# Patient Record
Sex: Male | Born: 1945 | Race: Black or African American | Hispanic: No | Marital: Married | Smoking: Former smoker
Health system: Southern US, Community
[De-identification: ages and names within clinical notes are randomized; demographics above are authoritative.]

## PROBLEM LIST (undated history)

## (undated) DIAGNOSIS — Z973 Presence of spectacles and contact lenses: Secondary | ICD-10-CM

## (undated) DIAGNOSIS — H409 Unspecified glaucoma: Secondary | ICD-10-CM

## (undated) DIAGNOSIS — M1990 Unspecified osteoarthritis, unspecified site: Secondary | ICD-10-CM

## (undated) DIAGNOSIS — N529 Male erectile dysfunction, unspecified: Secondary | ICD-10-CM

## (undated) DIAGNOSIS — C61 Malignant neoplasm of prostate: Secondary | ICD-10-CM

## (undated) DIAGNOSIS — H5702 Anisocoria: Secondary | ICD-10-CM

## (undated) DIAGNOSIS — Z8719 Personal history of other diseases of the digestive system: Secondary | ICD-10-CM

## (undated) DIAGNOSIS — I10 Essential (primary) hypertension: Secondary | ICD-10-CM

## (undated) DIAGNOSIS — E785 Hyperlipidemia, unspecified: Secondary | ICD-10-CM

## (undated) DIAGNOSIS — M25469 Effusion, unspecified knee: Secondary | ICD-10-CM

## (undated) HISTORY — DX: Unspecified glaucoma: H40.9

## (undated) HISTORY — PX: PROSTATE BIOPSY: SHX241

## (undated) HISTORY — DX: Anisocoria: H57.02

## (undated) HISTORY — DX: Hyperlipidemia, unspecified: E78.5

## (undated) HISTORY — DX: Male erectile dysfunction, unspecified: N52.9

## (undated) HISTORY — PX: COLONOSCOPY: SHX174

## (undated) HISTORY — DX: Essential (primary) hypertension: I10

## (undated) HISTORY — DX: Effusion, unspecified knee: M25.469

---

## 2002-11-04 HISTORY — PX: EYE SURGERY: SHX253

## 2004-07-29 ENCOUNTER — Emergency Department (HOSPITAL_COMMUNITY): Admission: EM | Admit: 2004-07-29 | Discharge: 2004-07-29 | Payer: Self-pay | Admitting: Emergency Medicine

## 2010-06-22 ENCOUNTER — Emergency Department (HOSPITAL_COMMUNITY): Admission: EM | Admit: 2010-06-22 | Discharge: 2010-06-22 | Payer: Self-pay | Admitting: Emergency Medicine

## 2013-08-02 ENCOUNTER — Emergency Department (HOSPITAL_COMMUNITY)
Admission: EM | Admit: 2013-08-02 | Discharge: 2013-08-02 | Disposition: A | Discharge status: Home / Self Care | Payer: No Typology Code available for payment source | Attending: Emergency Medicine | Admitting: Emergency Medicine

## 2013-08-02 ENCOUNTER — Encounter (HOSPITAL_COMMUNITY): Payer: Self-pay | Admitting: Emergency Medicine

## 2013-08-02 DIAGNOSIS — R61 Generalized hyperhidrosis: Secondary | ICD-10-CM | POA: Insufficient documentation

## 2013-08-02 DIAGNOSIS — J069 Acute upper respiratory infection, unspecified: Secondary | ICD-10-CM | POA: Insufficient documentation

## 2013-08-02 DIAGNOSIS — B9789 Other viral agents as the cause of diseases classified elsewhere: Secondary | ICD-10-CM

## 2013-08-02 MED ORDER — BENZONATATE 100 MG PO CAPS: 100.0000 mg | ORAL_CAPSULE | Freq: Three times a day (TID) | ORAL | Status: DC | PRN

## 2013-08-02 MED ORDER — PSEUDOEPHEDRINE HCL ER 120 MG PO TB12: 120.0000 mg | ORAL_TABLET | Freq: Two times a day (BID) | ORAL | Status: DC

## 2013-08-02 NOTE — ED Provider Notes (Signed)
CSN: [id number]     Arrival date & time 08/02/13  2130 History   First MD Initiated Contact with Patient 08/02/13 [phone]     Chief Complaint  Patient presents with  . Nasal Congestion  . Cough   (Consider location/radiation/quality/duration/timing/severity/associated sxs/prior Treatment) HPI Comments: Patient reports he has been sick for three days with cough productive of yellow sputum, sneezing, nasal congestion, and sweating.  Has tried mucinex and alkaselzer with minimal relief and vicks vapor rub with moderate relief.  Previously had sore throat and ear pain but these have improved.  Pt states grandchildren have been sick with same but they have already improved.  Denies chest pain, SOB, wheezing, abdominal pain, N/V/D, difficulty swallowing.   Patient is a 67 y.o. male presenting with cough. The history is provided by the patient.  Cough Associated symptoms: diaphoresis and rhinorrhea   Associated symptoms: no chest pain, no ear pain, no fever, no shortness of breath and no sore throat     History reviewed. No pertinent past medical history. History reviewed. No pertinent past surgical history. History reviewed. No pertinent family history. History  Substance Use Topics  . Smoking status: Never Smoker   . Smokeless tobacco: Never Used  . Alcohol Use: No    Review of Systems  Constitutional: Positive for diaphoresis. Negative for fever.  HENT: Positive for congestion and rhinorrhea. Negative for ear pain, sore throat, trouble swallowing, voice change and sinus pressure.   Respiratory: Positive for cough. Negative for shortness of breath.   Cardiovascular: Negative for chest pain.  Gastrointestinal: Negative for nausea, vomiting, abdominal pain and diarrhea.    Allergies  Review of patient's allergies indicates no known allergies.  Home Medications  No current outpatient prescriptions on file. BP 138/80  Pulse 85  Temp(Src) 98 F (36.7 C) (Oral)  Ht 5\' 10"  (1.778 m)  Wt  180 lb (81.647 kg)  BMI 25.83 kg/m2  SpO2 98% Physical Exam  Nursing note and vitals reviewed. Constitutional: He appears well-developed and well-nourished. No distress.  HENT:  Head: Normocephalic and atraumatic.  Mouth/Throat: Uvula is midline. Mucous membranes are not dry. No edematous. Posterior oropharyngeal erythema present. No oropharyngeal exudate, posterior oropharyngeal edema or tonsillar abscesses.  Neck: Neck supple.  Cardiovascular: Normal rate and regular rhythm.   Pulmonary/Chest: Effort normal and breath sounds normal. No accessory muscle usage. Not tachypneic. No respiratory distress. He has no decreased breath sounds. He has no wheezes. He has no rhonchi. He has no rales.  Abdominal: Soft. He exhibits no distension and no mass. There is no tenderness. There is no rebound and no guarding.  Lymphadenopathy:    He has no cervical adenopathy.  Neurological: He is alert. He exhibits normal muscle tone.  Skin: He is not diaphoretic.    ED Course  Procedures (including critical care time) Labs Review Labs Reviewed - No data to display Imaging Review No results found.  MDM   1. Viral respiratory illness    Pt with three days of respiratory illness in an otherwise healthy patient. Lungs CTAB.  Afebrile, nontoxic. No meningeal signs.  Oropharynx with erythema only.  No sinus tenderness.  Likely viral illness.  +sick contacts at home.  D/C home with medications for symptoms (sudafed, tessalon perles).  PCP follow up.  Discussed  findings, treatment, and follow up  with patient.  Pt given return precautions.  Pt verbalizes understanding and agrees with plan.       I doubt any other EMC precluding discharge at this  time including, but not necessarily limited to the following: significant bacterial infection including pneumonia, strep throat, bacterial sinusitis, meningitis  Trixie Dredge, PA-C 08/02/13 0809

## 2013-08-02 NOTE — ED Notes (Signed)
Pt presents to ED with c/o of head cold and chest congestion x3 days.  Pt denies n/v, diarrhea, no body aches.  Pt reports taking mucinex and Alka Seltzer but states they have been ineffective.

## 2013-08-02 NOTE — ED Provider Notes (Signed)
Medical screening examination/treatment/procedure(s) were conducted as a shared visit with non-physician practitioner(s) and myself.  I personally evaluated the patient during the encounter.  Presented with URI symptoms consisting of nasal congestion and cough. Lung exam clear, no wheezine, no concern for pneumonia. Treat symptomatically.  Gilda Crease, MD 08/02/13 [phone]

## 2014-04-14 ENCOUNTER — Encounter (INDEPENDENT_AMBULATORY_CARE_PROVIDER_SITE_OTHER): Payer: Self-pay | Admitting: Surgery

## 2014-04-25 ENCOUNTER — Ambulatory Visit (INDEPENDENT_AMBULATORY_CARE_PROVIDER_SITE_OTHER): Payer: Commercial Managed Care - HMO | Admitting: Surgery

## 2014-04-25 ENCOUNTER — Encounter (INDEPENDENT_AMBULATORY_CARE_PROVIDER_SITE_OTHER): Payer: Self-pay | Admitting: Surgery

## 2014-04-25 VITALS — BP 126/80 | HR 67 | Temp 98.4°F | Ht 69.0 in | Wt 188.0 lb

## 2014-04-25 DIAGNOSIS — K4090 Unilateral inguinal hernia, without obstruction or gangrene, not specified as recurrent: Secondary | ICD-10-CM

## 2014-04-25 NOTE — Progress Notes (Signed)
Patient ID: Manuel Bowers, male   DOB: [birth year], 68 y.o.   MRN: [id number]  Chief Complaint  Patient presents with  . eval lih    HPI Manuel Bowers is a 68 y.o. male.  Referred by Dr. Lona Kettle for evaluation of left inguinal hernia  HPI This is a healthy 68 year old male who presents with a two-month history of a visible bulge in his left groin. This occasionally becomes larger and more uncomfortable. It remains reducible. He denies any obstructive symptoms. 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  He has and able to continue working without problems. Evaluated by Dr. Harrington Challenger who felt that he had a left inguinal hernia. He is now referred for surgical evaluation. Past Medical History  Diagnosis Date  . Hypertension   . ED (erectile dysfunction)   . Elevated lipids   . Fluid in knee   . Glaucoma   . Anisocoria     Past Surgical History  Procedure Laterality Date  . Eye surgery      History reviewed. No pertinent family history.  Social History History  Substance Use Topics  . Smoking status: Never Smoker   . Smokeless tobacco: Never Used  . Alcohol Use: No    No Known Allergies  Current Outpatient Prescriptions  Medication Sig Dispense Refill  . aspirin 81 MG tablet Take 81 mg by mouth daily.      . beta carotene w/minerals (OCUVITE) tablet Take 1 tablet by mouth daily.      . brimonidine (ALPHAGAN) 0.2 % ophthalmic solution 3 (three) times daily.      . Dorzolamide HCl-Timolol Mal PF 22.3-6.8 MG/ML SOLN Apply to eye.      . hydrochlorothiazide (HYDRODIURIL) 25 MG tablet Take 25 mg by mouth daily.      . simvastatin (ZOCOR) 40 MG tablet Take 40 mg by mouth daily.       No current facility-administered medications for this visit.    Review of Systems Review of Systems  Constitutional: Negative for fever, chills and unexpected weight change.  HENT: Negative for congestion, hearing loss, sore throat, trouble swallowing and  voice change.   Eyes: Negative for visual disturbance.  Respiratory: Negative for cough and wheezing.   Cardiovascular: Negative for chest pain, palpitations and leg swelling.  Gastrointestinal: Negative for nausea, vomiting, abdominal pain, diarrhea, constipation, blood in stool, abdominal distention, anal bleeding and rectal pain.  Genitourinary: Positive for testicular pain. Negative for hematuria and difficulty urinating.  Musculoskeletal: Negative for arthralgias.  Skin: Negative for rash and wound.  Neurological: Negative for seizures, syncope, weakness and headaches.  Hematological: Negative for adenopathy. Does not bruise/bleed easily.  Psychiatric/Behavioral: Negative for confusion.    Blood pressure 126/80, pulse 67, temperature 98.4 F (36.9 C), height 5\' 9"  (1.753 m), weight 188 lb (85.276 kg).  Physical Exam Physical Exam WDWN in NAD HEENT:  EOMI, sclera anicteric Neck:  No masses, no thyromegaly Lungs:  CTA bilaterally; normal respiratory effort CV:  Regular rate and rhythm; no murmurs Abd:  +bowel sounds, soft, non-tender, no masses GU: Bilateral descended testes, no testicular masses, no sign of right inguinal hernia, reducible left inguinal hernia with Valsalva maneuver. Ext:  Well-perfused; no edema Skin:  Warm, dry; no sign of jaundice  Data Reviewed none  Assessment    Left inguinal hernia - reducible     Plan    Left inguinal hernia repair with mesh.  The surgical procedure has been discussed with  the patient.  Potential risks, benefits, alternative treatments, and expected outcomes have been explained.  All of the patient's questions at this time have been answered.  The likelihood of reaching the patient's treatment goal is good.  The patient understand the proposed surgical procedure and wishes to proceed.  Hold ASA 5 days        Shelita Steptoe K. 04/25/2014, 12:17 PM

## 2014-05-19 ENCOUNTER — Encounter (HOSPITAL_BASED_OUTPATIENT_CLINIC_OR_DEPARTMENT_OTHER): Payer: Self-pay | Admitting: *Deleted

## 2014-05-19 NOTE — Progress Notes (Signed)
05/19/14 1126  OBSTRUCTIVE SLEEP APNEA  Have you ever been diagnosed with sleep apnea through a sleep study? No  Do you snore loudly (loud enough to be heard through closed doors)?  0  Do you often feel tired, fatigued, or sleepy during the daytime? 0  Has anyone observed you stop breathing during your sleep? 0  Do you have, or are you being treated for high blood pressure? 1  BMI more than 35 kg/m2? 0  Age over 71 years old? 1  Neck circumference greater than 40 cm/16 inches? 1  Gender: 1  Obstructive Sleep Apnea Score 4  Score 4 or greater  Results sent to PCP

## 2014-05-19 NOTE — Progress Notes (Signed)
To come in for ekg-bmet-very active and healthy-

## 2014-05-23 ENCOUNTER — Other Ambulatory Visit: Payer: Self-pay

## 2014-05-23 ENCOUNTER — Encounter (HOSPITAL_BASED_OUTPATIENT_CLINIC_OR_DEPARTMENT_OTHER)
Admission: RE | Admit: 2014-05-23 | Discharge: 2014-05-23 | Disposition: A | Discharge status: Home / Self Care | Payer: Medicare PPO | Source: Ambulatory Visit | Attending: Surgery | Admitting: Surgery

## 2014-05-23 DIAGNOSIS — Z01812 Encounter for preprocedural laboratory examination: Secondary | ICD-10-CM | POA: Diagnosis not present

## 2014-05-23 DIAGNOSIS — N529 Male erectile dysfunction, unspecified: Secondary | ICD-10-CM | POA: Diagnosis not present

## 2014-05-23 DIAGNOSIS — I10 Essential (primary) hypertension: Secondary | ICD-10-CM | POA: Diagnosis not present

## 2014-05-23 DIAGNOSIS — K4090 Unilateral inguinal hernia, without obstruction or gangrene, not specified as recurrent: Secondary | ICD-10-CM | POA: Diagnosis not present

## 2014-05-23 DIAGNOSIS — H409 Unspecified glaucoma: Secondary | ICD-10-CM | POA: Diagnosis not present

## 2014-05-23 DIAGNOSIS — Z7982 Long term (current) use of aspirin: Secondary | ICD-10-CM | POA: Diagnosis not present

## 2014-05-23 DIAGNOSIS — H5702 Anisocoria: Secondary | ICD-10-CM | POA: Diagnosis not present

## 2014-05-23 LAB — BASIC METABOLIC PANEL
Anion gap: 13 (ref 5–15)
BUN: 19 mg/dL (ref 6–23)
CHLORIDE: 97 meq/L (ref 96–112)
CO2: 29 mEq/L (ref 19–32)
Calcium: 9.6 mg/dL (ref 8.4–10.5)
Creatinine, Ser: 0.9 mg/dL (ref 0.50–1.35)
GFR calc Af Amer: >90 mL/min (ref >90)
GFR, EST NON AFRICAN AMERICAN: 84 mL/min — AB (ref >90)
GLUCOSE: 112 mg/dL — AB (ref 70–99)
POTASSIUM: 4.3 meq/L (ref 3.7–5.3)
SODIUM: 139 meq/L (ref 137–147)

## 2014-05-24 ENCOUNTER — Encounter (HOSPITAL_BASED_OUTPATIENT_CLINIC_OR_DEPARTMENT_OTHER)
Admission: RE | Disposition: A | Discharge status: Home / Self Care | Payer: Self-pay | Source: Ambulatory Visit | Attending: Surgery

## 2014-05-24 ENCOUNTER — Ambulatory Visit (HOSPITAL_BASED_OUTPATIENT_CLINIC_OR_DEPARTMENT_OTHER)
Admission: RE | Admit: 2014-05-24 | Discharge: 2014-05-24 | Disposition: A | Discharge status: Home / Self Care | Payer: Medicare PPO | Source: Ambulatory Visit | Attending: Surgery | Admitting: Surgery

## 2014-05-24 ENCOUNTER — Ambulatory Visit (HOSPITAL_BASED_OUTPATIENT_CLINIC_OR_DEPARTMENT_OTHER): Payer: Medicare PPO | Admitting: Anesthesiology

## 2014-05-24 ENCOUNTER — Encounter (HOSPITAL_BASED_OUTPATIENT_CLINIC_OR_DEPARTMENT_OTHER): Payer: Medicare PPO | Admitting: Anesthesiology

## 2014-05-24 ENCOUNTER — Encounter (HOSPITAL_BASED_OUTPATIENT_CLINIC_OR_DEPARTMENT_OTHER): Payer: Self-pay | Admitting: *Deleted

## 2014-05-24 DIAGNOSIS — K4090 Unilateral inguinal hernia, without obstruction or gangrene, not specified as recurrent: Secondary | ICD-10-CM

## 2014-05-24 DIAGNOSIS — Z7982 Long term (current) use of aspirin: Secondary | ICD-10-CM | POA: Insufficient documentation

## 2014-05-24 DIAGNOSIS — Z01812 Encounter for preprocedural laboratory examination: Secondary | ICD-10-CM | POA: Insufficient documentation

## 2014-05-24 DIAGNOSIS — H409 Unspecified glaucoma: Secondary | ICD-10-CM | POA: Insufficient documentation

## 2014-05-24 DIAGNOSIS — I10 Essential (primary) hypertension: Secondary | ICD-10-CM | POA: Insufficient documentation

## 2014-05-24 DIAGNOSIS — N529 Male erectile dysfunction, unspecified: Secondary | ICD-10-CM | POA: Insufficient documentation

## 2014-05-24 DIAGNOSIS — H5702 Anisocoria: Secondary | ICD-10-CM | POA: Insufficient documentation

## 2014-05-24 HISTORY — DX: Presence of spectacles and contact lenses: Z97.3

## 2014-05-24 HISTORY — PX: INGUINAL HERNIA REPAIR: SHX194

## 2014-05-24 LAB — POCT HEMOGLOBIN-HEMACUE: Hemoglobin: 14.7 g/dL (ref 13.0–17.0)

## 2014-05-24 SURGERY — REPAIR, HERNIA, INGUINAL, ADULT
Anesthesia: General | Site: Groin | Laterality: Left

## 2014-05-24 MED ORDER — MIDAZOLAM HCL 2 MG/2ML IJ SOLN
1.0000 mg | INTRAMUSCULAR | Status: DC | PRN
  Administered 2014-05-24: 2 mg via INTRAVENOUS

## 2014-05-24 MED ORDER — SUCCINYLCHOLINE CHLORIDE 20 MG/ML IJ SOLN
INTRAMUSCULAR | Status: AC
  Filled 2014-05-24: qty 1

## 2014-05-24 MED ORDER — PROPOFOL 10 MG/ML IV BOLUS
INTRAVENOUS | Status: DC | PRN
  Administered 2014-05-24: 180 mg via INTRAVENOUS

## 2014-05-24 MED ORDER — HYDROCODONE-ACETAMINOPHEN 5-325 MG PO TABS
1.0000 | ORAL_TABLET | ORAL | Status: DC | PRN
  Administered 2014-05-24: 1 via ORAL

## 2014-05-24 MED ORDER — DEXAMETHASONE SODIUM PHOSPHATE 4 MG/ML IJ SOLN
INTRAMUSCULAR | Status: DC | PRN
  Administered 2014-05-24: 8 mg via INTRAVENOUS

## 2014-05-24 MED ORDER — MORPHINE SULFATE 2 MG/ML IJ SOLN: 2.0000 mg | INTRAMUSCULAR | Status: DC | PRN

## 2014-05-24 MED ORDER — MIDAZOLAM HCL 2 MG/2ML IJ SOLN
INTRAMUSCULAR | Status: AC
  Filled 2014-05-24: qty 2

## 2014-05-24 MED ORDER — CHLORHEXIDINE GLUCONATE 4 % EX LIQD
1.0000 | Freq: Once | CUTANEOUS | Status: DC
Start: 2014-05-25 — End: 2014-05-24

## 2014-05-24 MED ORDER — FENTANYL CITRATE 0.05 MG/ML IJ SOLN
50.0000 ug | INTRAMUSCULAR | Status: DC | PRN
  Administered 2014-05-24: 100 ug via INTRAVENOUS

## 2014-05-24 MED ORDER — LIDOCAINE HCL (CARDIAC) 20 MG/ML IV SOLN
INTRAVENOUS | Status: DC | PRN
  Administered 2014-05-24: 75 mg via INTRAVENOUS

## 2014-05-24 MED ORDER — FENTANYL CITRATE 0.05 MG/ML IJ SOLN
INTRAMUSCULAR | Status: AC
  Filled 2014-05-24: qty 2

## 2014-05-24 MED ORDER — BUPIVACAINE-EPINEPHRINE 0.25% -1:200000 IJ SOLN
INTRAMUSCULAR | Status: DC | PRN
  Administered 2014-05-24: 10 mL

## 2014-05-24 MED ORDER — ONDANSETRON HCL 4 MG/2ML IJ SOLN
INTRAMUSCULAR | Status: DC | PRN
  Administered 2014-05-24: 4 mg via INTRAVENOUS

## 2014-05-24 MED ORDER — BUPIVACAINE HCL (PF) 0.25 % IJ SOLN
INTRAMUSCULAR | Status: DC | PRN
  Administered 2014-05-24: 40 mL

## 2014-05-24 MED ORDER — ONDANSETRON HCL 4 MG/2ML IJ SOLN: 4.0000 mg | INTRAMUSCULAR | Status: DC | PRN

## 2014-05-24 MED ORDER — BUPIVACAINE-EPINEPHRINE (PF) 0.25% -1:200000 IJ SOLN
INTRAMUSCULAR | Status: AC
  Filled 2014-05-24: qty 30

## 2014-05-24 MED ORDER — LACTATED RINGERS IV SOLN
INTRAVENOUS | Status: DC
  Administered 2014-05-24: 07:00:00 via INTRAVENOUS
  Administered 2014-05-24: 10 mL/h via INTRAVENOUS

## 2014-05-24 MED ORDER — CEFAZOLIN SODIUM-DEXTROSE 2-3 GM-% IV SOLR
INTRAVENOUS | Status: AC
  Filled 2014-05-24: qty 50

## 2014-05-24 MED ORDER — PROPOFOL 10 MG/ML IV EMUL
INTRAVENOUS | Status: AC
Start: 2014-05-24 — End: 2014-05-24
  Filled 2014-05-24: qty 100

## 2014-05-24 MED ORDER — HYDROCODONE-ACETAMINOPHEN 5-325 MG PO TABS: 1.0000 | ORAL_TABLET | ORAL | Status: DC | PRN

## 2014-05-24 MED ORDER — FENTANYL CITRATE 0.05 MG/ML IJ SOLN
INTRAMUSCULAR | Status: AC
  Filled 2014-05-24: qty 6

## 2014-05-24 MED ORDER — HYDROCODONE-ACETAMINOPHEN 5-325 MG PO TABS
ORAL_TABLET | ORAL | Status: AC
  Filled 2014-05-24: qty 1

## 2014-05-24 MED ORDER — CEFAZOLIN SODIUM-DEXTROSE 2-3 GM-% IV SOLR
2.0000 g | INTRAVENOUS | Status: AC
  Administered 2014-05-24: 2 g via INTRAVENOUS

## 2014-05-24 SURGICAL SUPPLY — 60 items
APL SKNCLS STERI-STRIP NONHPOA (GAUZE/BANDAGES/DRESSINGS) ×1
BENZOIN TINCTURE PRP APPL 2/3 (GAUZE/BANDAGES/DRESSINGS) ×3 IMPLANT
BINDER BREAST LRG (GAUZE/BANDAGES/DRESSINGS) ×2 IMPLANT
BLADE HEX COATED 2.75 (ELECTRODE) ×3 IMPLANT
BLADE SURG 15 STRL LF DISP TIS (BLADE) ×2 IMPLANT
BLADE SURG 15 STRL SS (BLADE) ×3
BLADE SURG ROTATE 9660 (MISCELLANEOUS) ×2 IMPLANT
CANISTER SUCT 1200ML W/VALVE (MISCELLANEOUS) IMPLANT
CHLORAPREP W/TINT 26ML (MISCELLANEOUS) ×3 IMPLANT
CLOSURE WOUND 1/2 X4 (GAUZE/BANDAGES/DRESSINGS) ×1
COVER MAYO STAND STRL (DRAPES) ×3 IMPLANT
COVER TABLE BACK 60X90 (DRAPES) ×3 IMPLANT
DECANTER SPIKE VIAL GLASS SM (MISCELLANEOUS) ×3 IMPLANT
DRAIN PENROSE 1/2X12 LTX STRL (WOUND CARE) ×3 IMPLANT
DRAPE LAPAROTOMY TRNSV 102X78 (DRAPE) ×3 IMPLANT
DRAPE UTILITY XL STRL (DRAPES) ×3 IMPLANT
DRSG TEGADERM 4X4.75 (GAUZE/BANDAGES/DRESSINGS) ×3 IMPLANT
ELECT REM PT RETURN 9FT ADLT (ELECTROSURGICAL) ×3
ELECTRODE REM PT RTRN 9FT ADLT (ELECTROSURGICAL) ×1 IMPLANT
GLOVE BIO SURGEON STRL SZ 6.5 (GLOVE) ×1 IMPLANT
GLOVE BIO SURGEON STRL SZ7 (GLOVE) ×3 IMPLANT
GLOVE BIO SURGEONS STRL SZ 6.5 (GLOVE) ×1
GLOVE BIOGEL PI IND STRL 7.0 (GLOVE) IMPLANT
GLOVE BIOGEL PI IND STRL 7.5 (GLOVE) ×1 IMPLANT
GLOVE BIOGEL PI INDICATOR 7.0 (GLOVE) ×2
GLOVE BIOGEL PI INDICATOR 7.5 (GLOVE) ×2
GLOVE EXAM NITRILE PF MED BLUE (GLOVE) ×2 IMPLANT
GOWN STRL REUS W/ TWL LRG LVL3 (GOWN DISPOSABLE) ×2 IMPLANT
GOWN STRL REUS W/TWL LRG LVL3 (GOWN DISPOSABLE) ×6
MESH PARIETEX PROGRIP LEFT (Mesh General) ×2 IMPLANT
NDL HYPO 25X1 1.5 SAFETY (NEEDLE) ×1 IMPLANT
NEEDLE HYPO 25X1 1.5 SAFETY (NEEDLE) ×3 IMPLANT
NS IRRIG 1000ML POUR BTL (IV SOLUTION) ×2 IMPLANT
PACK BASIN DAY SURGERY FS (CUSTOM PROCEDURE TRAY) ×3 IMPLANT
PENCIL BUTTON HOLSTER BLD 10FT (ELECTRODE) ×3 IMPLANT
SLEEVE SCD COMPRESS KNEE MED (MISCELLANEOUS) ×3 IMPLANT
SPONGE GAUZE 4X4 12PLY STER LF (GAUZE/BANDAGES/DRESSINGS) ×3 IMPLANT
SPONGE INTESTINAL PEANUT (DISPOSABLE) ×3 IMPLANT
SPONGE LAP 18X18 X RAY DECT (DISPOSABLE) IMPLANT
STRIP CLOSURE SKIN 1/2X4 (GAUZE/BANDAGES/DRESSINGS) ×2 IMPLANT
SUT MON AB 4-0 PC3 18 (SUTURE) ×3 IMPLANT
SUT PDS AB 0 CT 36 (SUTURE) IMPLANT
SUT SILK 2 0 SH (SUTURE) IMPLANT
SUT SILK 3 0 SH 30 (SUTURE) IMPLANT
SUT SILK 3 0 TIES 17X18 (SUTURE)
SUT SILK 3-0 18XBRD TIE BLK (SUTURE) IMPLANT
SUT VIC AB 0 CT1 27 (SUTURE) ×3
SUT VIC AB 0 CT1 27XBRD ANBCTR (SUTURE) ×1 IMPLANT
SUT VIC AB 0 SH 27 (SUTURE) IMPLANT
SUT VIC AB 2-0 SH 27 (SUTURE) ×3
SUT VIC AB 2-0 SH 27XBRD (SUTURE) ×1 IMPLANT
SUT VIC AB 3-0 SH 27 (SUTURE) ×3
SUT VIC AB 3-0 SH 27X BRD (SUTURE) ×1 IMPLANT
SYRINGE CONTROL L 12CC (SYRINGE) ×3 IMPLANT
SYRINGE CONTROL LL 12CC (SYRINGE) ×1 IMPLANT
TOWEL OR 17X24 6PK STRL BLUE (TOWEL DISPOSABLE) ×3 IMPLANT
TOWEL OR NON WOVEN STRL DISP B (DISPOSABLE) ×3 IMPLANT
TUBE CONNECTING 20'X1/4 (TUBING)
TUBE CONNECTING 20X1/4 (TUBING) IMPLANT
YANKAUER SUCT BULB TIP NO VENT (SUCTIONS) IMPLANT

## 2014-05-24 NOTE — Anesthesia Preprocedure Evaluation (Signed)
Anesthesia Evaluation  Patient identified by MRN, date of birth, ID band Patient awake    Reviewed: Allergy & Precautions, H&P , NPO status , Patient's Chart, lab work & pertinent test results  History of Anesthesia Complications Negative for: history of anesthetic complications  Airway Mallampati: I  Neck ROM: Full    Dental  (+) Teeth Intact   Pulmonary neg pulmonary ROS,  breath sounds clear to auscultation        Cardiovascular hypertension, Rhythm:Regular Rate:Normal     Neuro/Psych negative neurological ROS     GI/Hepatic negative GI ROS, Neg liver ROS,   Endo/Other  negative endocrine ROS  Renal/GU      Musculoskeletal   Abdominal   Peds  Hematology   Anesthesia Other Findings   Reproductive/Obstetrics                           Anesthesia Physical Anesthesia Plan  ASA: II  Anesthesia Plan:    Post-op Pain Management:    Induction: Intravenous  Airway Management Planned: Oral ETT  Additional Equipment:   Intra-op Plan:   Post-operative Plan: Extubation in OR  Informed Consent: I have reviewed the patients History and Physical, chart, labs and discussed the procedure including the risks, benefits and alternatives for the proposed anesthesia with the patient or authorized representative who has indicated his/her understanding and acceptance.   Dental advisory given  Plan Discussed with: CRNA and Surgeon  Anesthesia Plan Comments:         Anesthesia Quick Evaluation

## 2014-05-24 NOTE — Interval H&P Note (Signed)
History and Physical Interval Note:  05/24/2014 7:21 AM  Manuel Bowers  has presented today for surgery, with the diagnosis of left inguinal hernia  The various methods of treatment have been discussed with the patient and family. After consideration of risks, benefits and other options for treatment, the patient has consented to  Procedure(s): LEFT INGUINAL HERNIA REPAIR  (Left) as a surgical intervention .  The patient's history has been reviewed, patient examined, no change in status, stable for surgery.  I have reviewed the patient's chart and labs.  Questions were answered to the patient's satisfaction.     Connelly [place] K.

## 2014-05-24 NOTE — Anesthesia Procedure Notes (Addendum)
Anesthesia Regional Block:  TAP block  Pre-Anesthetic Checklist: ,, timeout performed, Correct Patient, Correct Site, Correct Laterality, Correct Procedure, Correct Position, site marked, Risks and benefits discussed,  Surgical consent,  Pre-op evaluation,  At surgeon's request and post-op pain management  Laterality: Left and Upper  Prep: chloraprep       Needles:   Needle Type: Echogenic Needle     Needle Length: 9cm 9 cm Needle Gauge: 21 and 21 G  Needle insertion depth: 4 cm   Additional Needles:  Procedures: ultrasound guided (picture in chart) TAP block Narrative:  Start time: 05/24/2014 7:00 AM End time: 05/24/2014 7:12 AM Injection made incrementally with aspirations every 5 mL.  Performed by: Personally  Anesthesiologist: T Massagee  Additional Notes: Monitors on , sedation begun,tolerated well   Procedure Name: LMA Insertion Date/Time: 05/24/2014 7:37 AM Performed by: Melynda Ripple D Pre-anesthesia Checklist: Patient identified, Emergency Drugs available, Suction available and Patient being monitored Patient Re-evaluated:Patient Re-evaluated prior to inductionOxygen Delivery Method: Circle System Utilized Preoxygenation: Pre-oxygenation with 100% oxygen Intubation Type: IV induction Ventilation: Mask ventilation without difficulty LMA: LMA inserted LMA Size: 4.0 Number of attempts: 1 Airway Equipment and Method: bite block Placement Confirmation: positive ETCO2 Tube secured with: Tape Dental Injury: Teeth and Oropharynx as per pre-operative assessment

## 2014-05-24 NOTE — Anesthesia Postprocedure Evaluation (Signed)
  Anesthesia Post-op Note  Patient: Manuel Bowers  Procedure(s) Performed: Procedure(s): LEFT INGUINAL HERNIA REPAIR  (Left)  Patient Location: PACU  Anesthesia Type:GA combined with regional for post-op pain  Level of Consciousness: awake and alert   Airway and Oxygen Therapy: Patient Spontanous Breathing  Post-op Pain: none  Post-op Assessment: Post-op Vital signs reviewed  Post-op Vital Signs: stable  Last Vitals:  Filed Vitals:   05/24/14 0930  BP: 131/72  Pulse: 57  Temp:   Resp: 18    Complications: No apparent anesthesia complications

## 2014-05-24 NOTE — Progress Notes (Signed)
AssistedDr. Massagee with left, ultrasound guided, transabdominal plane block. Side rails up, monitors on throughout procedure. See vital signs in flow sheet. Tolerated Procedure well.  

## 2014-05-24 NOTE — H&P (View-Only) (Signed)
Patient ID: Manuel Bowers, male   DOB: 03-01-1946, 68 y.o.   MRN: [id number]  Chief Complaint  Patient presents with  . eval lih    HPI Manuel Bowers is a 68 y.o. male.  Referred by Dr. Lona Kettle for evaluation of left inguinal hernia  HPI This is a healthy 68 year old male who presents with a two-month history of a visible bulge in his left groin. This occasionally becomes larger and more uncomfortable. It remains reducible. He denies any obstructive symptoms. 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  He has and able to continue working without problems. Evaluated by Dr. Harrington Challenger who felt that he had a left inguinal hernia. He is now referred for surgical evaluation. Past Medical History  Diagnosis Date  . Hypertension   . ED (erectile dysfunction)   . Elevated lipids   . Fluid in knee   . Glaucoma   . Anisocoria     Past Surgical History  Procedure Laterality Date  . Eye surgery      History reviewed. No pertinent family history.  Social History History  Substance Use Topics  . Smoking status: Never Smoker   . Smokeless tobacco: Never Used  . Alcohol Use: No    No Known Allergies  Current Outpatient Prescriptions  Medication Sig Dispense Refill  . aspirin 81 MG tablet Take 81 mg by mouth daily.      . beta carotene w/minerals (OCUVITE) tablet Take 1 tablet by mouth daily.      . brimonidine (ALPHAGAN) 0.2 % ophthalmic solution 3 (three) times daily.      . Dorzolamide HCl-Timolol Mal PF 22.3-6.8 MG/ML SOLN Apply to eye.      . hydrochlorothiazide (HYDRODIURIL) 25 MG tablet Take 25 mg by mouth daily.      . simvastatin (ZOCOR) 40 MG tablet Take 40 mg by mouth daily.       No current facility-administered medications for this visit.    Review of Systems Review of Systems  Constitutional: Negative for fever, chills and unexpected weight change.  HENT: Negative for congestion, hearing loss, sore throat, trouble swallowing and  voice change.   Eyes: Negative for visual disturbance.  Respiratory: Negative for cough and wheezing.   Cardiovascular: Negative for chest pain, palpitations and leg swelling.  Gastrointestinal: Negative for nausea, vomiting, abdominal pain, diarrhea, constipation, blood in stool, abdominal distention, anal bleeding and rectal pain.  Genitourinary: Positive for testicular pain. Negative for hematuria and difficulty urinating.  Musculoskeletal: Negative for arthralgias.  Skin: Negative for rash and wound.  Neurological: Negative for seizures, syncope, weakness and headaches.  Hematological: Negative for adenopathy. Does not bruise/bleed easily.  Psychiatric/Behavioral: Negative for confusion.    Blood pressure 126/80, pulse 67, temperature 98.4 F (36.9 C), height 5\' 9"  (1.753 m), weight 188 lb (85.276 kg).  Physical Exam Physical Exam WDWN in NAD HEENT:  EOMI, sclera anicteric Neck:  No masses, no thyromegaly Lungs:  CTA bilaterally; normal respiratory effort CV:  Regular rate and rhythm; no murmurs Abd:  +bowel sounds, soft, non-tender, no masses GU: Bilateral descended testes, no testicular masses, no sign of right inguinal hernia, reducible left inguinal hernia with Valsalva maneuver. Ext:  Well-perfused; no edema Skin:  Warm, dry; no sign of jaundice  Data Reviewed none  Assessment    Left inguinal hernia - reducible     Plan    Left inguinal hernia repair with mesh.  The surgical procedure has been discussed with  the patient.  Potential risks, benefits, alternative treatments, and expected outcomes have been explained.  All of the patient's questions at this time have been answered.  The likelihood of reaching the patient's treatment goal is good.  The patient understand the proposed surgical procedure and wishes to proceed.  Hold ASA 5 days        TSUEI,MATTHEW K. 04/25/2014, 12:17 PM

## 2014-05-24 NOTE — Op Note (Signed)
Hernia, Open, Procedure Note  Indications: The patient presented with a history of a left, reducible inguinal hernia.    Pre-operative Diagnosis: left reducible inguinal hernia Post-operative Diagnosis: same  Surgeon: Maia Petties.   Assistants: none  Anesthesia: General LMA anesthesia and TAP block  ASA Class: 2  Procedure Details  The patient was seen again in the Holding Room. The risks, benefits, complications, treatment options, and expected outcomes were discussed with the patient. The possibilities of reaction to medication, pulmonary aspiration, perforation of viscus, bleeding, recurrent infection, the need for additional procedures, and development of a complication requiring transfusion or further operation were discussed with the patient and/or family. The likelihood of success in repairing the hernia and returning the patient to their previous functional status is good.  There was concurrence with the proposed plan, and informed consent was obtained. The site of surgery was properly noted/marked. The patient was taken to the Operating Room, identified as Manuel Bowers, and the procedure verified as left inguinal hernia repair. A Time Out was held and the above information confirmed.  The patient was placed in the supine position and underwent induction of anesthesia. The lower abdomen and groin was prepped with Chloraprep and draped in the standard fashion, and 0.25% Marcaine with epinephrine was used to anesthetize the skin over the mid-portion of the inguinal canal. An oblique incision was made. Dissection was carried down through the subcutaneous tissue with cautery to the external oblique fascia.  We opened the external oblique fascia along the direction of its fibers to the external ring.  The spermatic cord was circumferentially dissected bluntly and retracted with a Penrose drain.  The ilioinguinal nerve was identified and preserved.  The floor of the inguinal canal was  inspected and was intact.  We skeletonized the spermatic cord and reduced a moderate-sized indirect hernia sac and cord lipoma.  We used a left-sided Progrip mesh which was inserted and deployed across the floor of the inguinal canal. The mesh was tucked underneath the external oblique fascia laterally.  The flap of the mesh was closed around the spermatic cord to recreate the internal inguinal ring.  The mesh was secured to the pubic tubercle with 0 Vicryl.  The external oblique fascia was reapproximated with 2-0 Vicryl.  3-0 Vicryl was used to close the subcutaneous tissues and 4-0 Monocryl was used to close the skin in subcuticular fashion.  Benzoin and steri-strips were used to seal the incision.  A clean dressing was applied.  The patient was then extubated and brought to the recovery room in stable condition.  All sponge, instrument, and needle counts were correct prior to closure and at the conclusion of the case.   Estimated Blood Loss: Minimal                 Complications: None; patient tolerated the procedure well.         Disposition: PACU - hemodynamically stable.         Condition: stable  [name]. Georgette Dover, MD, [facility]  General/ Trauma Surgery  05/24/2014 8:24 AM

## 2014-05-24 NOTE — Transfer of Care (Signed)
Immediate Anesthesia Transfer of Care Note  Patient: Manuel Bowers  Procedure(s) Performed: Procedure(s): LEFT INGUINAL HERNIA REPAIR  (Left)  Patient Location: PACU  Anesthesia Type:General and Regional  Level of Consciousness: sedated  Airway & Oxygen Therapy: Patient Spontanous Breathing and Patient connected to face mask oxygen  Post-op Assessment: Report given to PACU RN and Post -op Vital signs reviewed and stable  Post vital signs: Reviewed and stable  Complications: No apparent anesthesia complications

## 2014-05-24 NOTE — Discharge Instructions (Signed)
[facility] ° ° INGUINAL HERNIA REPAIR: POST OP INSTRUCTIONS ° °Always review your discharge instruction sheet given to you by the facility where your surgery was performed. °IF YOU HAVE DISABILITY OR FAMILY LEAVE FORMS, YOU MUST BRING THEM TO THE OFFICE FOR PROCESSING.   °DO NOT GIVE THEM TO YOUR DOCTOR. ° °1. A  prescription for pain medication may be given to you upon discharge.  Take your pain medication as prescribed, if needed.  If narcotic pain medicine is not needed, then you may take acetaminophen (Tylenol) or ibuprofen (Advil) as needed. °2. Take your usually prescribed medications unless otherwise directed. °3. If you need a refill on your pain medication, please contact your pharmacy.  They will contact our office to request authorization. Prescriptions will not be filled after 5 pm or on week-ends. °4. You should follow a light diet the first 24 hours after arrival home, such as soup and crackers, etc.  Be sure to include lots of fluids daily.  Resume your normal diet the day after surgery. °5. Most patients will experience some swelling and bruising around the umbilicus or in the groin and scrotum.  Ice packs and reclining will help.  Swelling and bruising can take several days to resolve.  °6. It is common to experience some constipation if taking pain medication after surgery.  Increasing fluid intake and taking a stool softener (such as Colace) will usually help or prevent this problem from occurring.  A mild laxative (Milk of Magnesia or Miralax) should be taken according to package directions if there are no bowel movements after 48 hours. °7. Unless discharge instructions indicate otherwise, you may remove your bandages 24-48 hours after surgery, and you may shower at that time.  You will have steri-strips (small skin tapes) in place directly over the incision.  These strips should be left on the skin for 7-10 days. °8. ACTIVITIES:  You may resume regular (light) daily activities  beginning the next day--such as daily self-care, walking, climbing stairs--gradually increasing activities as tolerated.  You may have sexual intercourse when it is comfortable.  Refrain from any heavy lifting or straining until approved by your doctor. °a. You may drive when you are no longer taking prescription pain medication, you can comfortably wear a seatbelt, and you can safely maneuver your car and apply brakes. °b. RETURN TO WORK:  2-3 weeks with light duty - no lifting over 15 lbs. °9. You should see your doctor in the office for a follow-up appointment approximately 2-3 weeks after your surgery.  Make sure that you call for this appointment within a day or two after you arrive home to insure a convenient appointment time. °10. OTHER INSTRUCTIONS:  __________________________________________________________________________________________________________________________________________________________________________________________  °WHEN TO CALL YOUR DOCTOR: °1. Fever over 101.0 °2. Inability to urinate °3. Nausea and/or vomiting °4. Extreme swelling or bruising °5. Continued bleeding from incision. °6. Increased pain, redness, or drainage from the incision ° °The clinic staff is available to answer your questions during regular business hours.  Please don’t hesitate to call and ask to speak to one of the nurses for clinical concerns.  If you have a medical emergency, go to the nearest emergency room or call 911.  A surgeon from [facility] is always on call at the hospital ° ° °[address], [address], [place], [place]  [address] ? ° [address] [address], [place],    [address] °[phone]    [phone]    FAX [phone] °Web site:   [email or web address]   Post Anesthesia Home Care Instructions  Activity: Get plenty of rest for the remainder of the day. A responsible adult should stay with you for 24 hours following the procedure.  For the next 24 hours, DO  NOT: -Drive a car -Paediatric nurse -Drink alcoholic beverages -Take any medication unless instructed by your physician -Make any legal decisions or sign important papers.  Meals: Start with liquid foods such as gelatin or soup. Progress to regular foods as tolerated. Avoid greasy, spicy, heavy foods. If nausea and/or vomiting occur, drink only clear liquids until the nausea and/or vomiting subsides. Call your physician if vomiting continues.  Special Instructions/Symptoms: Your throat may feel dry or sore from the anesthesia or the breathing tube placed in your throat during surgery. If this causes discomfort, gargle with warm salt water. The discomfort should disappear within 24 hours.

## 2014-05-25 ENCOUNTER — Encounter (HOSPITAL_BASED_OUTPATIENT_CLINIC_OR_DEPARTMENT_OTHER): Payer: Self-pay | Admitting: Surgery

## 2014-05-25 ENCOUNTER — Telehealth (INDEPENDENT_AMBULATORY_CARE_PROVIDER_SITE_OTHER): Payer: Self-pay

## 2014-05-25 NOTE — Telephone Encounter (Signed)
Calling pt to see how he was doing after sx. Pt states that he has been doing good. Reminded pt of po appt. Informed pt that if they needed anything before then to give [place] a call back. Pt verbalized understanding.

## 2014-06-14 ENCOUNTER — Ambulatory Visit (INDEPENDENT_AMBULATORY_CARE_PROVIDER_SITE_OTHER): Payer: Commercial Managed Care - HMO | Admitting: Surgery

## 2014-06-14 ENCOUNTER — Encounter (INDEPENDENT_AMBULATORY_CARE_PROVIDER_SITE_OTHER): Payer: Self-pay | Admitting: Surgery

## 2014-06-14 VITALS — BP 150/90 | HR 65 | Temp 97.6°F | Ht 69.0 in | Wt 195.4 lb

## 2014-06-14 DIAGNOSIS — K4090 Unilateral inguinal hernia, without obstruction or gangrene, not specified as recurrent: Secondary | ICD-10-CM

## 2014-06-14 NOTE — Progress Notes (Signed)
Status post left inguinal hernia repair with mesh on 05/24/14 for a moderate size indirect hernia. The patient is doing very well. He had virtually no pain after surgery. We did use a TA P. Block. The swelling is going down nicely. No testicular pain.  His incision is well-healed with no sign of infection or seroma. No sign of recurrent hernia. Minimal scar tissue formation.  The patient should continue limiting his activity for the next 3 weeks and then may resume full activity. Followup when necessary.  [name]. Georgette Dover, MD, [facility]  General/ Trauma Surgery  06/14/2014 9:04 AM

## 2014-11-30 DIAGNOSIS — H2512 Age-related nuclear cataract, left eye: Secondary | ICD-10-CM | POA: Diagnosis not present

## 2014-11-30 DIAGNOSIS — H524 Presbyopia: Secondary | ICD-10-CM | POA: Diagnosis not present

## 2014-11-30 DIAGNOSIS — H4011X3 Primary open-angle glaucoma, severe stage: Secondary | ICD-10-CM | POA: Diagnosis not present

## 2014-12-16 DIAGNOSIS — M1990 Unspecified osteoarthritis, unspecified site: Secondary | ICD-10-CM | POA: Diagnosis not present

## 2014-12-16 DIAGNOSIS — J9801 Acute bronchospasm: Secondary | ICD-10-CM | POA: Diagnosis not present

## 2014-12-16 DIAGNOSIS — J209 Acute bronchitis, unspecified: Secondary | ICD-10-CM | POA: Diagnosis not present

## 2015-01-24 DIAGNOSIS — J209 Acute bronchitis, unspecified: Secondary | ICD-10-CM | POA: Diagnosis not present

## 2015-01-24 DIAGNOSIS — N399 Disorder of urinary system, unspecified: Secondary | ICD-10-CM | POA: Diagnosis not present

## 2015-01-28 ENCOUNTER — Emergency Department (INDEPENDENT_AMBULATORY_CARE_PROVIDER_SITE_OTHER): Payer: Medicare PPO

## 2015-01-28 ENCOUNTER — Emergency Department (INDEPENDENT_AMBULATORY_CARE_PROVIDER_SITE_OTHER)
Admission: EM | Admit: 2015-01-28 | Discharge: 2015-01-28 | Disposition: A | Discharge status: Home / Self Care | Payer: Commercial Managed Care - HMO | Source: Home / Self Care | Attending: Family Medicine | Admitting: Family Medicine

## 2015-01-28 ENCOUNTER — Encounter (HOSPITAL_COMMUNITY): Payer: Self-pay | Admitting: *Deleted

## 2015-01-28 DIAGNOSIS — M19042 Primary osteoarthritis, left hand: Secondary | ICD-10-CM

## 2015-01-28 DIAGNOSIS — S6992XA Unspecified injury of left wrist, hand and finger(s), initial encounter: Secondary | ICD-10-CM | POA: Diagnosis not present

## 2015-01-28 MED ORDER — COLCHICINE 0.6 MG PO TABS: 0.6000 mg | ORAL_TABLET | Freq: Two times a day (BID) | ORAL | Status: DC

## 2015-01-28 MED ORDER — INDOMETHACIN 25 MG PO CAPS: 25.0000 mg | ORAL_CAPSULE | Freq: Three times a day (TID) | ORAL | Status: DC

## 2015-01-28 NOTE — Discharge Instructions (Signed)
Wear splint and take medicine as prescribed, see your doctor if further problems.

## 2015-01-28 NOTE — ED Notes (Signed)
Pt  Reports  Pain in his  l  Wrist  X  3  Days  -  He  denys  Any  Injury       He  States  Has  Had  Arthritis  In  Past

## 2015-01-28 NOTE — ED Provider Notes (Signed)
CSN: [id number]     Arrival date & time 01/28/15  1512 History   First MD Initiated Contact with Patient 01/28/15 1613     Chief Complaint  Patient presents with  . Wrist Pain   (Consider location/radiation/quality/duration/timing/severity/associated sxs/prior Treatment) Patient is a 69 y.o. male presenting with wrist pain. The history is provided by the patient.  Wrist Pain This is a new problem. The current episode started more than 2 days ago. The problem has been gradually worsening. Pertinent negatives include no chest pain and no abdominal pain. The symptoms are aggravated by bending.    Past Medical History  Diagnosis Date  . Hypertension   . ED (erectile dysfunction)   . Elevated lipids   . Fluid in knee   . Glaucoma   . Anisocoria   . Wears glasses    Past Surgical History  Procedure Laterality Date  . Eye surgery  2004    lt eye for glaucoma  . Colonoscopy    . Inguinal hernia repair Left 05/24/2014    Procedure: LEFT INGUINAL HERNIA REPAIR ;  Surgeon: [name]. Georgette Dover, MD;  Location: [facility];  Service: General;  Laterality: Left;   History reviewed. No pertinent family history. History  Substance Use Topics  . Smoking status: Never Smoker   . Smokeless tobacco: Never Used  . Alcohol Use: No    Review of Systems  Constitutional: Negative.   Cardiovascular: Negative for chest pain.  Gastrointestinal: Negative for abdominal pain.  Musculoskeletal: Positive for joint swelling.  Skin: Negative for wound.    Allergies  Review of patient's allergies indicates no known allergies.  Home Medications   Prior to Admission medications   Medication Sig Start Date End Date Taking? Authorizing Provider  aspirin 81 MG tablet Take 81 mg by mouth daily.    Historical Provider, MD  brimonidine (ALPHAGAN) 0.2 % ophthalmic solution 3 (three) times daily.    Historical Provider, MD  colchicine 0.6 MG tablet Take 1 tablet (0.6 mg total) by mouth 2 (two)  times daily. 01/28/15   Billy Fischer, MD  Dorzolamide HCl-Timolol Mal PF 22.3-6.8 MG/ML SOLN Apply to eye.    Historical Provider, MD  hydrochlorothiazide (HYDRODIURIL) 25 MG tablet Take 25 mg by mouth daily.    Historical Provider, MD  indomethacin (INDOCIN) 25 MG capsule Take 1 capsule (25 mg total) by mouth 3 (three) times daily with meals. 01/28/15   Billy Fischer, MD  simvastatin (ZOCOR) 40 MG tablet Take 40 mg by mouth daily.    Historical Provider, MD   BP 151/69 mmHg  Pulse 82  Temp(Src) 98.6 F (37 C) (Oral)  Resp 16  SpO2 98% Physical Exam  Constitutional: He is oriented to person, place, and time. He appears well-developed and well-nourished.  Musculoskeletal: He exhibits tenderness.       Left wrist: He exhibits decreased range of motion, tenderness, bony tenderness, swelling and effusion. He exhibits no crepitus, no deformity and no laceration.  Neurological: He is alert and oriented to person, place, and time.  Skin: Skin is warm and dry.  Nursing note and vitals reviewed.   ED Course  Procedures (including critical care time) Labs Review Labs Reviewed - No data to display  Imaging Review Dg Wrist Complete Left  01/28/2015   CLINICAL DATA:  69 year old male with 2-3 day history of left-sided wrist pain. No history of injury.  EXAM: LEFT WRIST - COMPLETE 3+ VIEW  COMPARISON:  No priors.  FINDINGS: Four  views of the left wrist demonstrate no acute displaced fracture. There is advanced joint space narrowing, subchondral sclerosis, subchondral cyst formation and osteophyte formation at the first carpometacarpal joint, with mild proximal subluxation of the first metacarpal, compatible with advanced osteoarthritis.  IMPRESSION: 1. No acute radiographic abnormality of the left wrist. 2. 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   Electronically Signed   By: Vinnie Langton M.D.   On: 01/28/2015 16:55   X-rays reviewed and report per radiologist.   MDM   1.  Primary osteoarthritis of left hand        Billy Fischer, MD 01/28/15 [phone]

## 2015-02-10 DIAGNOSIS — H4011X3 Primary open-angle glaucoma, severe stage: Secondary | ICD-10-CM | POA: Diagnosis not present

## 2015-02-21 DIAGNOSIS — M79642 Pain in left hand: Secondary | ICD-10-CM | POA: Diagnosis not present

## 2015-03-21 DIAGNOSIS — H4011X2 Primary open-angle glaucoma, moderate stage: Secondary | ICD-10-CM | POA: Diagnosis not present

## 2015-03-21 DIAGNOSIS — H4011X3 Primary open-angle glaucoma, severe stage: Secondary | ICD-10-CM | POA: Diagnosis not present

## 2015-04-24 DIAGNOSIS — H2511 Age-related nuclear cataract, right eye: Secondary | ICD-10-CM | POA: Diagnosis not present

## 2015-04-24 DIAGNOSIS — H18412 Arcus senilis, left eye: Secondary | ICD-10-CM | POA: Diagnosis not present

## 2015-04-24 DIAGNOSIS — H4011X3 Primary open-angle glaucoma, severe stage: Secondary | ICD-10-CM | POA: Diagnosis not present

## 2015-04-24 DIAGNOSIS — H18411 Arcus senilis, right eye: Secondary | ICD-10-CM | POA: Diagnosis not present

## 2015-04-24 DIAGNOSIS — Z961 Presence of intraocular lens: Secondary | ICD-10-CM | POA: Diagnosis not present

## 2015-04-24 DIAGNOSIS — H4011X1 Primary open-angle glaucoma, mild stage: Secondary | ICD-10-CM | POA: Diagnosis not present

## 2015-09-08 DIAGNOSIS — H401192 Primary open-angle glaucoma, unspecified eye, moderate stage: Secondary | ICD-10-CM | POA: Diagnosis not present

## 2015-09-08 DIAGNOSIS — Z961 Presence of intraocular lens: Secondary | ICD-10-CM | POA: Diagnosis not present

## 2015-10-06 DIAGNOSIS — Z125 Encounter for screening for malignant neoplasm of prostate: Secondary | ICD-10-CM | POA: Diagnosis not present

## 2015-10-06 DIAGNOSIS — M150 Primary generalized (osteo)arthritis: Secondary | ICD-10-CM | POA: Diagnosis not present

## 2015-10-06 DIAGNOSIS — E782 Mixed hyperlipidemia: Secondary | ICD-10-CM | POA: Diagnosis not present

## 2015-10-06 DIAGNOSIS — I10 Essential (primary) hypertension: Secondary | ICD-10-CM | POA: Diagnosis not present

## 2015-10-06 DIAGNOSIS — Z0000 Encounter for general adult medical examination without abnormal findings: Secondary | ICD-10-CM | POA: Diagnosis not present

## 2016-01-14 ENCOUNTER — Encounter (HOSPITAL_COMMUNITY): Payer: Self-pay | Admitting: Emergency Medicine

## 2016-01-14 ENCOUNTER — Emergency Department (HOSPITAL_COMMUNITY)
Admission: EM | Admit: 2016-01-14 | Discharge: 2016-01-14 | Disposition: A | Discharge status: Home / Self Care | Payer: Commercial Managed Care - HMO | Attending: Emergency Medicine | Admitting: Emergency Medicine

## 2016-01-14 ENCOUNTER — Emergency Department (HOSPITAL_COMMUNITY): Payer: Commercial Managed Care - HMO

## 2016-01-14 DIAGNOSIS — Z87438 Personal history of other diseases of male genital organs: Secondary | ICD-10-CM | POA: Insufficient documentation

## 2016-01-14 DIAGNOSIS — I10 Essential (primary) hypertension: Secondary | ICD-10-CM | POA: Diagnosis not present

## 2016-01-14 DIAGNOSIS — H409 Unspecified glaucoma: Secondary | ICD-10-CM | POA: Diagnosis not present

## 2016-01-14 DIAGNOSIS — M1990 Unspecified osteoarthritis, unspecified site: Secondary | ICD-10-CM | POA: Insufficient documentation

## 2016-01-14 DIAGNOSIS — M7989 Other specified soft tissue disorders: Secondary | ICD-10-CM | POA: Diagnosis not present

## 2016-01-14 DIAGNOSIS — E785 Hyperlipidemia, unspecified: Secondary | ICD-10-CM | POA: Diagnosis not present

## 2016-01-14 DIAGNOSIS — M79645 Pain in left finger(s): Secondary | ICD-10-CM | POA: Diagnosis present

## 2016-01-14 DIAGNOSIS — Z79899 Other long term (current) drug therapy: Secondary | ICD-10-CM | POA: Insufficient documentation

## 2016-01-14 DIAGNOSIS — Z7982 Long term (current) use of aspirin: Secondary | ICD-10-CM | POA: Insufficient documentation

## 2016-01-14 DIAGNOSIS — Z973 Presence of spectacles and contact lenses: Secondary | ICD-10-CM | POA: Insufficient documentation

## 2016-01-14 HISTORY — DX: Unspecified osteoarthritis, unspecified site: M19.90

## 2016-01-14 MED ORDER — HYDROCODONE-ACETAMINOPHEN 5-325 MG PO TABS: 1.0000 | ORAL_TABLET | Freq: Four times a day (QID) | ORAL | Status: DC | PRN

## 2016-01-14 NOTE — ED Provider Notes (Signed)
CSN: [id number]     Arrival date & time 01/14/16  1322 History   First MD Initiated Contact with Patient 01/14/16 1341     Chief Complaint  Patient presents with  . hand swelling      (Consider location/radiation/quality/duration/timing/severity/associated sxs/prior Treatment) HPI Comments: 70 year old right-handed male who presents with left hand pain. Patient states that for the past one week, he has had swelling involving primarily his thumb and fifth finger on left hand. Pain is worse with movement and he suspects that it is related to arthritis. He denies any redness or skin changes. No fevers or recent illness. No recent trauma to his hand. He does use his hands a lot at work. Taking ibuprofen without relief of his pain.  The history is provided by the patient.    Past Medical History  Diagnosis Date  . Hypertension   . ED (erectile dysfunction)   . Elevated lipids   . Fluid in knee   . Glaucoma   . Anisocoria   . Wears glasses   . Arthritis    Past Surgical History  Procedure Laterality Date  . Eye surgery  2004    lt eye for glaucoma  . Colonoscopy    . Inguinal hernia repair Left 05/24/2014    Procedure: LEFT INGUINAL HERNIA REPAIR ;  Surgeon: [name]. Georgette Dover, MD;  Location: [facility];  Service: General;  Laterality: Left;   No family history on file. Social History  Substance Use Topics  . Smoking status: Never Smoker   . Smokeless tobacco: Never Used  . Alcohol Use: No    Review of Systems 10 Systems reviewed and are negative for acute change except as noted in the HPI.    Allergies  Review of patient's allergies indicates no known allergies.  Home Medications   Prior to Admission medications   Medication Sig Start Date End Date Taking? Authorizing Provider  aspirin 81 MG tablet Take 81 mg by mouth daily.   Yes Historical Provider, MD  brimonidine (ALPHAGAN) 0.2 % ophthalmic solution Place 1 drop into both eyes 3 (three) times daily.     Yes Historical Provider, MD  Dorzolamide HCl-Timolol Mal PF 22.3-6.8 MG/ML SOLN Apply 1 drop to eye 2 (two) times daily.    Yes Historical Provider, MD  hydrochlorothiazide (HYDRODIURIL) 25 MG tablet Take 25 mg by mouth daily.   Yes Historical Provider, MD  ibuprofen (ADVIL,MOTRIN) 200 MG tablet Take 200 mg by mouth every 8 (eight) hours as needed for moderate pain.   Yes Historical Provider, MD  latanoprost (XALATAN) 0.005 % ophthalmic solution Place 1 drop into both eyes at bedtime. 12/02/15  Yes Historical Provider, MD  simvastatin (ZOCOR) 40 MG tablet Take 40 mg by mouth daily.   Yes Historical Provider, MD   BP 114/74 mmHg  Pulse 64  Temp(Src) 97.6 F (36.4 C) (Oral)  Resp 16  SpO2 97% Physical Exam  Constitutional: He is oriented to person, place, and time. He appears well-developed and well-nourished. No distress.  HENT:  Head: Normocephalic and atraumatic.  Eyes: Conjunctivae are normal.  Cardiovascular: Intact distal pulses.   Pulmonary/Chest: Effort normal.  Musculoskeletal:  Uniform mild edema and mild tenderness of left 5th finger, esp at DIP and PIP, TTP and mild swelling of 1st MCP joint; normal ROM wrist and fingers, normal grip strength w/ limited ROM 5th finger  Neurological: He is alert and oriented to person, place, and time.  Skin: Skin is warm and dry. No rash  noted. No erythema.  Psychiatric: He has a normal mood and affect. Judgment normal.  Nursing note and vitals reviewed.   ED Course  Procedures (including critical care time) Labs Review Labs Reviewed - No data to display  Imaging Review Dg Hand Complete Left  01/14/2016  CLINICAL DATA:  Swelling in the left hand for 1 week. EXAM: LEFT HAND - COMPLETE 3+ VIEW COMPARISON:  Left wrist radiographs 01/28/2015 FINDINGS: Soft tissue swelling is noted over the ulnar aspect of the wrist. There is soft tissue swelling in the fifth digit is well. Degenerative changes are noted at the first [facility] joint. No other focal  osseous abnormality is present. IMPRESSION: 1. Soft tissue swelling along the ulnar aspect of the wrist to the small finger without underlying osseous abnormality or radiopaque foreign body. 2. Stable advanced degenerative changes at the first [facility] joint. Electronically Signed   By: San Morelle M.D.   On: 01/14/2016 14:07      MDM   Final diagnoses:  None   Patient with 1 week of left hand pain involving first MCP joint and fifth finger as well as intermittently his left wrist. On exam he had swelling in these areas without any skin changes to suggest infection. No history of gout and no sx to suggest infection. Plain film shows advanced arthritis of first [facility] joint, soft tissue swelling with no other maladies. Instructed patient on supportive care, he already has a thumb brace that he can use at home. Provided with small amount of Lortab to use at night for intractable pain. Provided with hand surgeon follow-up information if symptoms do not improve. Return precautions reviewed and patient discharged in satisfactory condition.  Sharlett Iles, MD 01/14/16 1540

## 2016-01-14 NOTE — ED Notes (Signed)
Per pt, states left hand and finger swelling for over a week-thinks it is arthritis

## 2016-01-15 ENCOUNTER — Telehealth: Payer: Self-pay | Admitting: Emergency Medicine

## 2016-01-25 DIAGNOSIS — M19042 Primary osteoarthritis, left hand: Secondary | ICD-10-CM | POA: Diagnosis not present

## 2016-03-22 DIAGNOSIS — H18412 Arcus senilis, left eye: Secondary | ICD-10-CM | POA: Diagnosis not present

## 2016-03-22 DIAGNOSIS — H401111 Primary open-angle glaucoma, right eye, mild stage: Secondary | ICD-10-CM | POA: Diagnosis not present

## 2016-03-22 DIAGNOSIS — H401123 Primary open-angle glaucoma, left eye, severe stage: Secondary | ICD-10-CM | POA: Diagnosis not present

## 2016-03-22 DIAGNOSIS — Z961 Presence of intraocular lens: Secondary | ICD-10-CM | POA: Diagnosis not present

## 2016-03-22 DIAGNOSIS — I10 Essential (primary) hypertension: Secondary | ICD-10-CM | POA: Diagnosis not present

## 2016-03-22 DIAGNOSIS — H18411 Arcus senilis, right eye: Secondary | ICD-10-CM | POA: Diagnosis not present

## 2016-07-12 DIAGNOSIS — M7751 Other enthesopathy of right foot: Secondary | ICD-10-CM | POA: Diagnosis not present

## 2016-07-12 DIAGNOSIS — M659 Synovitis and tenosynovitis, unspecified: Secondary | ICD-10-CM | POA: Diagnosis not present

## 2016-07-12 DIAGNOSIS — M79671 Pain in right foot: Secondary | ICD-10-CM | POA: Diagnosis not present

## 2016-07-12 DIAGNOSIS — M19071 Primary osteoarthritis, right ankle and foot: Secondary | ICD-10-CM | POA: Diagnosis not present

## 2016-07-12 DIAGNOSIS — M10071 Idiopathic gout, right ankle and foot: Secondary | ICD-10-CM | POA: Diagnosis not present

## 2016-07-17 DIAGNOSIS — Z961 Presence of intraocular lens: Secondary | ICD-10-CM | POA: Diagnosis not present

## 2016-07-17 DIAGNOSIS — H2511 Age-related nuclear cataract, right eye: Secondary | ICD-10-CM | POA: Diagnosis not present

## 2016-07-17 DIAGNOSIS — H25011 Cortical age-related cataract, right eye: Secondary | ICD-10-CM | POA: Diagnosis not present

## 2016-07-17 DIAGNOSIS — H401134 Primary open-angle glaucoma, bilateral, indeterminate stage: Secondary | ICD-10-CM | POA: Diagnosis not present

## 2016-07-30 DIAGNOSIS — E790 Hyperuricemia without signs of inflammatory arthritis and tophaceous disease: Secondary | ICD-10-CM | POA: Diagnosis not present

## 2016-07-30 DIAGNOSIS — Z79899 Other long term (current) drug therapy: Secondary | ICD-10-CM | POA: Diagnosis not present

## 2016-07-30 DIAGNOSIS — Z23 Encounter for immunization: Secondary | ICD-10-CM | POA: Diagnosis not present

## 2016-07-30 DIAGNOSIS — I10 Essential (primary) hypertension: Secondary | ICD-10-CM | POA: Diagnosis not present

## 2016-08-01 DIAGNOSIS — H401112 Primary open-angle glaucoma, right eye, moderate stage: Secondary | ICD-10-CM | POA: Diagnosis not present

## 2016-08-01 DIAGNOSIS — H25011 Cortical age-related cataract, right eye: Secondary | ICD-10-CM | POA: Diagnosis not present

## 2016-08-01 DIAGNOSIS — H2511 Age-related nuclear cataract, right eye: Secondary | ICD-10-CM | POA: Diagnosis not present

## 2016-08-01 DIAGNOSIS — H401123 Primary open-angle glaucoma, left eye, severe stage: Secondary | ICD-10-CM | POA: Diagnosis not present

## 2016-09-05 DIAGNOSIS — I10 Essential (primary) hypertension: Secondary | ICD-10-CM | POA: Diagnosis not present

## 2016-09-05 DIAGNOSIS — H4010X2 Unspecified open-angle glaucoma, moderate stage: Secondary | ICD-10-CM | POA: Diagnosis not present

## 2016-09-05 DIAGNOSIS — Z87891 Personal history of nicotine dependence: Secondary | ICD-10-CM | POA: Diagnosis not present

## 2016-09-05 DIAGNOSIS — Z79899 Other long term (current) drug therapy: Secondary | ICD-10-CM | POA: Diagnosis not present

## 2016-09-05 DIAGNOSIS — H2512 Age-related nuclear cataract, left eye: Secondary | ICD-10-CM | POA: Diagnosis not present

## 2016-09-05 DIAGNOSIS — H25011 Cortical age-related cataract, right eye: Secondary | ICD-10-CM | POA: Diagnosis not present

## 2016-09-05 DIAGNOSIS — H401112 Primary open-angle glaucoma, right eye, moderate stage: Secondary | ICD-10-CM | POA: Diagnosis not present

## 2016-09-05 DIAGNOSIS — Z7982 Long term (current) use of aspirin: Secondary | ICD-10-CM | POA: Diagnosis not present

## 2016-09-05 DIAGNOSIS — E785 Hyperlipidemia, unspecified: Secondary | ICD-10-CM | POA: Diagnosis not present

## 2016-09-05 DIAGNOSIS — H2511 Age-related nuclear cataract, right eye: Secondary | ICD-10-CM | POA: Diagnosis not present

## 2016-09-30 DIAGNOSIS — Z9841 Cataract extraction status, right eye: Secondary | ICD-10-CM | POA: Diagnosis not present

## 2016-09-30 DIAGNOSIS — Z961 Presence of intraocular lens: Secondary | ICD-10-CM | POA: Diagnosis not present

## 2016-09-30 DIAGNOSIS — H5989 Other postprocedural complications and disorders of eye and adnexa, not elsewhere classified: Secondary | ICD-10-CM | POA: Diagnosis not present

## 2016-09-30 DIAGNOSIS — H401112 Primary open-angle glaucoma, right eye, moderate stage: Secondary | ICD-10-CM | POA: Diagnosis not present

## 2016-10-01 DIAGNOSIS — H401112 Primary open-angle glaucoma, right eye, moderate stage: Secondary | ICD-10-CM | POA: Diagnosis not present

## 2016-10-01 DIAGNOSIS — H401123 Primary open-angle glaucoma, left eye, severe stage: Secondary | ICD-10-CM | POA: Diagnosis not present

## 2016-10-02 DIAGNOSIS — Z87891 Personal history of nicotine dependence: Secondary | ICD-10-CM | POA: Diagnosis not present

## 2016-10-02 DIAGNOSIS — Z79899 Other long term (current) drug therapy: Secondary | ICD-10-CM | POA: Diagnosis not present

## 2016-10-02 DIAGNOSIS — H401133 Primary open-angle glaucoma, bilateral, severe stage: Secondary | ICD-10-CM | POA: Diagnosis not present

## 2016-10-02 DIAGNOSIS — H401113 Primary open-angle glaucoma, right eye, severe stage: Secondary | ICD-10-CM | POA: Diagnosis not present

## 2016-10-02 DIAGNOSIS — I10 Essential (primary) hypertension: Secondary | ICD-10-CM | POA: Diagnosis not present

## 2016-10-02 DIAGNOSIS — Z7982 Long term (current) use of aspirin: Secondary | ICD-10-CM | POA: Diagnosis not present

## 2016-10-02 DIAGNOSIS — E785 Hyperlipidemia, unspecified: Secondary | ICD-10-CM | POA: Diagnosis not present

## 2016-11-15 DIAGNOSIS — H5203 Hypermetropia, bilateral: Secondary | ICD-10-CM | POA: Diagnosis not present

## 2016-11-15 DIAGNOSIS — H43393 Other vitreous opacities, bilateral: Secondary | ICD-10-CM | POA: Diagnosis not present

## 2016-11-15 DIAGNOSIS — H40033 Anatomical narrow angle, bilateral: Secondary | ICD-10-CM | POA: Diagnosis not present

## 2016-11-15 DIAGNOSIS — H52209 Unspecified astigmatism, unspecified eye: Secondary | ICD-10-CM | POA: Diagnosis not present

## 2016-11-15 DIAGNOSIS — H524 Presbyopia: Secondary | ICD-10-CM | POA: Diagnosis not present

## 2016-11-15 DIAGNOSIS — Z0100 Encounter for examination of eyes and vision without abnormal findings: Secondary | ICD-10-CM | POA: Diagnosis not present

## 2016-11-28 DIAGNOSIS — I10 Essential (primary) hypertension: Secondary | ICD-10-CM | POA: Diagnosis not present

## 2016-11-28 DIAGNOSIS — E782 Mixed hyperlipidemia: Secondary | ICD-10-CM | POA: Diagnosis not present

## 2016-11-28 DIAGNOSIS — M109 Gout, unspecified: Secondary | ICD-10-CM | POA: Diagnosis not present

## 2016-11-28 DIAGNOSIS — Z0000 Encounter for general adult medical examination without abnormal findings: Secondary | ICD-10-CM | POA: Diagnosis not present

## 2016-11-28 DIAGNOSIS — Z125 Encounter for screening for malignant neoplasm of prostate: Secondary | ICD-10-CM | POA: Diagnosis not present

## 2016-11-28 DIAGNOSIS — Z23 Encounter for immunization: Secondary | ICD-10-CM | POA: Diagnosis not present

## 2016-12-11 DIAGNOSIS — H401113 Primary open-angle glaucoma, right eye, severe stage: Secondary | ICD-10-CM | POA: Diagnosis not present

## 2017-01-09 DIAGNOSIS — Z961 Presence of intraocular lens: Secondary | ICD-10-CM | POA: Diagnosis not present

## 2017-01-09 DIAGNOSIS — H401113 Primary open-angle glaucoma, right eye, severe stage: Secondary | ICD-10-CM | POA: Diagnosis not present

## 2017-02-12 DIAGNOSIS — H401113 Primary open-angle glaucoma, right eye, severe stage: Secondary | ICD-10-CM | POA: Diagnosis not present

## 2017-02-12 DIAGNOSIS — H401123 Primary open-angle glaucoma, left eye, severe stage: Secondary | ICD-10-CM | POA: Diagnosis not present

## 2017-02-12 DIAGNOSIS — H401133 Primary open-angle glaucoma, bilateral, severe stage: Secondary | ICD-10-CM | POA: Diagnosis not present

## 2017-02-20 DIAGNOSIS — H401123 Primary open-angle glaucoma, left eye, severe stage: Secondary | ICD-10-CM | POA: Diagnosis not present

## 2017-02-20 DIAGNOSIS — H2511 Age-related nuclear cataract, right eye: Secondary | ICD-10-CM | POA: Diagnosis not present

## 2017-02-20 DIAGNOSIS — Z9841 Cataract extraction status, right eye: Secondary | ICD-10-CM | POA: Diagnosis not present

## 2017-02-20 DIAGNOSIS — M1990 Unspecified osteoarthritis, unspecified site: Secondary | ICD-10-CM | POA: Diagnosis not present

## 2017-02-20 DIAGNOSIS — H401133 Primary open-angle glaucoma, bilateral, severe stage: Secondary | ICD-10-CM | POA: Diagnosis not present

## 2017-02-20 DIAGNOSIS — I10 Essential (primary) hypertension: Secondary | ICD-10-CM | POA: Diagnosis not present

## 2017-02-20 DIAGNOSIS — H25011 Cortical age-related cataract, right eye: Secondary | ICD-10-CM | POA: Diagnosis not present

## 2017-02-20 DIAGNOSIS — E785 Hyperlipidemia, unspecified: Secondary | ICD-10-CM | POA: Diagnosis not present

## 2017-02-20 DIAGNOSIS — Z87891 Personal history of nicotine dependence: Secondary | ICD-10-CM | POA: Diagnosis not present

## 2017-02-20 DIAGNOSIS — Z79899 Other long term (current) drug therapy: Secondary | ICD-10-CM | POA: Diagnosis not present

## 2017-02-20 DIAGNOSIS — H401113 Primary open-angle glaucoma, right eye, severe stage: Secondary | ICD-10-CM | POA: Diagnosis not present

## 2017-03-25 DIAGNOSIS — Z1211 Encounter for screening for malignant neoplasm of colon: Secondary | ICD-10-CM | POA: Diagnosis not present

## 2017-03-25 DIAGNOSIS — K5730 Diverticulosis of large intestine without perforation or abscess without bleeding: Secondary | ICD-10-CM | POA: Diagnosis not present

## 2017-05-30 DIAGNOSIS — R972 Elevated prostate specific antigen [PSA]: Secondary | ICD-10-CM | POA: Diagnosis not present

## 2017-08-07 DIAGNOSIS — R972 Elevated prostate specific antigen [PSA]: Secondary | ICD-10-CM | POA: Diagnosis not present

## 2017-08-15 DIAGNOSIS — H401123 Primary open-angle glaucoma, left eye, severe stage: Secondary | ICD-10-CM | POA: Diagnosis not present

## 2017-08-15 DIAGNOSIS — Z961 Presence of intraocular lens: Secondary | ICD-10-CM | POA: Diagnosis not present

## 2017-08-15 DIAGNOSIS — H401133 Primary open-angle glaucoma, bilateral, severe stage: Secondary | ICD-10-CM | POA: Diagnosis not present

## 2017-08-15 DIAGNOSIS — H401113 Primary open-angle glaucoma, right eye, severe stage: Secondary | ICD-10-CM | POA: Diagnosis not present

## 2017-08-21 DIAGNOSIS — C61 Malignant neoplasm of prostate: Secondary | ICD-10-CM | POA: Diagnosis not present

## 2017-08-22 ENCOUNTER — Other Ambulatory Visit: Payer: Self-pay | Admitting: Urology

## 2017-08-22 DIAGNOSIS — C61 Malignant neoplasm of prostate: Secondary | ICD-10-CM

## 2017-09-10 ENCOUNTER — Encounter (HOSPITAL_COMMUNITY)
Admission: RE | Admit: 2017-09-10 | Discharge: 2017-09-10 | Disposition: A | Discharge status: Home / Self Care | Payer: Medicare HMO | Source: Ambulatory Visit | Attending: Urology | Admitting: Urology

## 2017-09-10 ENCOUNTER — Ambulatory Visit (HOSPITAL_COMMUNITY)
Admission: RE | Admit: 2017-09-10 | Discharge: 2017-09-10 | Disposition: A | Discharge status: Home / Self Care | Payer: Medicare HMO | Source: Ambulatory Visit | Attending: Urology | Admitting: Urology

## 2017-09-10 ENCOUNTER — Ambulatory Visit (HOSPITAL_COMMUNITY): Admission: RE | Admit: 2017-09-10 | Payer: Medicare HMO | Source: Ambulatory Visit

## 2017-09-10 DIAGNOSIS — Z8546 Personal history of malignant neoplasm of prostate: Secondary | ICD-10-CM | POA: Diagnosis not present

## 2017-09-10 DIAGNOSIS — C61 Malignant neoplasm of prostate: Secondary | ICD-10-CM

## 2017-09-10 MED ORDER — FLUDEOXYGLUCOSE F - 18 (FDG) INJECTION
21.1000 | Freq: Once | INTRAVENOUS | Status: AC | PRN
  Administered 2017-09-10: 21.1 via INTRAVENOUS

## 2017-09-11 ENCOUNTER — Ambulatory Visit
Admission: RE | Admit: 2017-09-11 | Discharge: 2017-09-11 | Disposition: A | Discharge status: Home / Self Care | Payer: Medicare HMO | Source: Ambulatory Visit | Attending: Radiation Oncology | Admitting: Radiation Oncology

## 2017-09-11 ENCOUNTER — Encounter: Payer: Self-pay | Admitting: Radiation Oncology

## 2017-09-11 VITALS — Resp 16 | Ht 68.0 in | Wt 193.8 lb

## 2017-09-11 DIAGNOSIS — C61 Malignant neoplasm of prostate: Secondary | ICD-10-CM | POA: Insufficient documentation

## 2017-09-11 DIAGNOSIS — Z87891 Personal history of nicotine dependence: Secondary | ICD-10-CM | POA: Diagnosis not present

## 2017-09-11 DIAGNOSIS — Z510 Encounter for antineoplastic radiation therapy: Secondary | ICD-10-CM | POA: Insufficient documentation

## 2017-09-11 DIAGNOSIS — R972 Elevated prostate specific antigen [PSA]: Secondary | ICD-10-CM | POA: Diagnosis not present

## 2017-09-11 DIAGNOSIS — Z79899 Other long term (current) drug therapy: Secondary | ICD-10-CM | POA: Insufficient documentation

## 2017-09-11 DIAGNOSIS — Z7982 Long term (current) use of aspirin: Secondary | ICD-10-CM | POA: Insufficient documentation

## 2017-09-11 DIAGNOSIS — I10 Essential (primary) hypertension: Secondary | ICD-10-CM | POA: Diagnosis not present

## 2017-09-11 DIAGNOSIS — H409 Unspecified glaucoma: Secondary | ICD-10-CM | POA: Diagnosis not present

## 2017-09-11 HISTORY — DX: Malignant neoplasm of prostate: C61

## 2017-09-11 NOTE — Progress Notes (Signed)
GU Location of Tumor / Histology: prostatic adenocarcinoma  If Prostate Cancer, Gleason Score is (4 + 3) and PSA is (4.88). Prostate volume: 44.96 grams  Manuel Bowers was referred to Dr. Alyson Ingles in July 2018 by Dr. Melinda Crutch for evaluation of an elevated PSA. Patient reports he has been a patient of Dr. Harrington Challenger for 15 years. He goes onto explain February 2018 was the first time during his yearly physical he was ever told he has an elevated PSA.  05/30/2017 PSA  4.69 12/2016  PSA  2.88  Biopsies of prostate (if applicable) revealed:    Past/Anticipated interventions by urology, if any: biopsy, referral to Dr. Tammi Klippel, bone scan (negative)  Past/Anticipated interventions by medical oncology, if any: no  Weight changes, if any: no  Bowel/Bladder complaints, if any: IPSS 11. Mild ED. Denies dysuria, hematuria, urinary leakage or incontinence. Denies any bowel complaints.    Nausea/Vomiting, if any: no  Pain issues, if any:  no  SAFETY ISSUES:  Prior radiation? no  Pacemaker/ICD? no  Possible current pregnancy? no  Is the patient on methotrexate? no  Current Complaints / other details:  71 year old male. Married with one son. Hx of glaucoma. Denies family hx of breast, colon or prostate ca. 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 Patient would like to continue to work. Patient works 8-5 with 2 days off per week. Patient resides in [place].

## 2017-09-11 NOTE — Progress Notes (Signed)
Radiation Oncology         [phone] ________________________________  Initial outpatient Consultation  Name: Manuel Bowers MRN: [id number]  Date: 09/11/2017  DOB: Apr 11, 1946  MP:NTIR, Dwyane Luo, MD  McKenzie, Candee Furbish, MD   REFERRING PHYSICIAN: Alyson Ingles Candee Furbish, MD  DIAGNOSIS: 71 y.o. gentleman with Stage T1c adenocarcinoma of the prostate with Gleason Score of 4+3, and PSA of 4.48    ICD-10-CM   1. Malignant neoplasm of prostate ([place]) C61 MR PELVIS LTD WITHOUT CONTRAST FOR PROSTATE RTP    HISTORY OF PRESENT ILLNESS: Manuel Bowers is a 71 y.o. male with a diagnosis of prostate cancer. He was noted to have an elevated PSA of 4.69 by his primary care physician, Dr. Melinda Crutch during a yearly physical in Feb 2018. Accordingly, he was referred for evaluation in urology by Dr. Alyson Ingles on 05/30/2017, digital rectal examination was performed at that time revealing symmetric prostate lobes without discrete nodularity. A PSA was repeated on 05/30/2017 and remained elevated at 4.48. The patient proceeded to transrectal ultrasound with 12 biopsies of the prostate on 08/07/2017.  The prostate volume measured 44.96 cc. Out of 12 core biopsies, 6 were positive, all on the left. The maximum Gleason score was 4+3, and this was seen in the left mid lateral.  Gleason 3+4 was seen in the left base, left base lateral, left mid, left apex and left apex lateral.  The patient had a CT A/P and Bone scan for disease staging completed on 09/10/2017, both of which were negative for evidence of metastatic disease.   The patient reviewed the biopsy results with his urologist and he has kindly been referred today for discussion of potential radiation treatment options.   PREVIOUS RADIATION THERAPY: No  PAST MEDICAL HISTORY:  Past Medical History:  Diagnosis Date  . Anisocoria   . Arthritis   . ED (erectile dysfunction)   . Elevated lipids   . Fluid in knee   . Glaucoma   . Hypertension   . Prostate  cancer ([place])   . Wears glasses       PAST SURGICAL HISTORY: Past Surgical History:  Procedure Laterality Date  . COLONOSCOPY    . EYE SURGERY  2004   lt eye for glaucoma  . PROSTATE BIOPSY      FAMILY HISTORY:  Family History  Problem Relation Age of Onset  . Cancer Neg Hx     SOCIAL HISTORY:  Social History   Socioeconomic History  . Marital status: Married    Spouse name: Not on file  . Number of children: Not on file  . Years of education: Not on file  . Highest education level: Not on file  Social Needs  . Financial resource strain: Not on file  . Food insecurity - worry: Not on file  . Food insecurity - inability: Not on file  . Transportation needs - medical: Not on file  . Transportation needs - non-medical: Not on file  Occupational History  . Not on file  Tobacco Use  . Smoking status: Former Smoker    Packs/day: 0.25    Years: 2.00    Pack years: 0.50    Types: Cigarettes    Last attempt to quit: 11/05/1963    Years since quitting: 53.8  . Smokeless tobacco: Never Used  Substance and Sexual Activity  . Alcohol use: No  . Drug use: No  . Sexual activity: Yes  Other Topics Concern  . Not on file  Social History Narrative  71 year old male. Married. Former smoker.     ALLERGIES: Patient has no known allergies.  MEDICATIONS:  Current Outpatient Medications  Medication Sig Dispense Refill  . Artificial Tear Solution (SOOTHE XP OP) Apply to eye.    Marland Kitchen aspirin 81 MG tablet Take 81 mg by mouth daily.    . brimonidine (ALPHAGAN) 0.2 % ophthalmic solution Place 1 drop into both eyes 3 (three) times daily.     . Dorzolamide HCl-Timolol Mal PF 22.3-6.8 MG/ML SOLN Apply 1 drop to eye 2 (two) times daily.     . hydrochlorothiazide (HYDRODIURIL) 25 MG tablet Take 25 mg by mouth daily.    Marland Kitchen latanoprost (XALATAN) 0.005 % ophthalmic solution Place 1 drop into both eyes at bedtime.    . simvastatin (ZOCOR) 40 MG tablet Take 40 mg by mouth daily.    Marland Kitchen  HYDROcodone-acetaminophen (NORCO/VICODIN) 5-325 MG tablet Take 1-2 tablets by mouth every 6 (six) hours as needed for severe pain. (Patient not taking: Reported on 09/11/2017) 8 tablet 0  . ibuprofen (ADVIL,MOTRIN) 200 MG tablet Take 200 mg by mouth every 8 (eight) hours as needed for moderate pain.     No current facility-administered medications for this encounter.     REVIEW OF SYSTEMS:  On review of systems, the patient reports that he is doing well overall. He denies any history of pulmonary disease, cardiac issues, or cardiac stents. He notes that he has a yearly physical, which he has been compliant with for the past 15 years and denies any prior abnormalities on his past physicals. He denies any chest pain, shortness of breath, cough, fevers, chills, night sweats, unintended weight changes. He denies any bowel disturbances, and denies abdominal pain, nausea or vomiting. He denies any new musculoskeletal or joint aches or pains. He denies bilateral lower extremities swelling. His IPSS was 11, indicating moderate urinary symptoms with frequency, urgency, weak stream and feelings of incomplete emptying periodically. He reports nocturia 2. He is able to complete sexual activity with most  attempts. A complete review of systems is obtained and is otherwise negative.   PHYSICAL EXAM:  Wt Readings from Last 3 Encounters:  09/11/17 193 lb 12.8 oz (87.9 kg)  06/14/14 195 lb 6.4 oz (88.6 kg)  05/24/14 187 lb 6 oz (85 kg)   Temp Readings from Last 3 Encounters:  01/14/16 97.6 F (36.4 C) (Oral)  01/28/15 98.6 F (37 C) (Oral)  06/14/14 97.6 F (36.4 C)   BP Readings from Last 3 Encounters:  01/14/16 114/74  01/28/15 151/69  06/14/14 (!) 150/90   Pulse Readings from Last 3 Encounters:  01/14/16 64  01/28/15 82  06/14/14 65   Pain Assessment Pain Score: 0-No pain/10  In general this is a well appearing African-American male in no acute distress. He is alert and oriented x4 and  appropriate throughout the examination. HEENT reveals that the patient is normocephalic, atraumatic. EOMs are intact. PERRLA. Skin is intact without any evidence of gross lesions. Cardiovascular exam reveals a regular rate and rhythm, no clicks rubs or murmurs are auscultated. Chest is clear to auscultation bilaterally. Lymphatic assessment is performed and does not reveal any adenopathy in the cervical, supraclavicular, axillary, or inguinal chains. Abdomen has active bowel sounds in all quadrants and is intact. The abdomen is soft, non tender, non distended. Lower extremities are negative for pretibial pitting edema, deep calf tenderness, cyanosis or clubbing.  KPS = 100  100 - Normal; no complaints; no evidence of disease. 90   -  Able to carry on normal activity; minor signs or symptoms of disease. 80   - Normal activity with effort; some signs or symptoms of disease. 60   - Cares for self; unable to carry on normal activity or to do active work. 60   - Requires occasional assistance, but is able to care for most of his personal needs. 50   - Requires considerable assistance and frequent medical care. 1   - Disabled; requires special care and assistance. 34   - Severely disabled; hospital admission is indicated although death not imminent. 35   - Very sick; hospital admission necessary; active supportive treatment necessary. 10   - Moribund; fatal processes progressing rapidly. 0     - Dead  Karnofsky DA, Abelmann [place], Craver LS and Burchenal [facility] [phone]) The use of the nitrogen mustards in the palliative treatment of carcinoma: with particular reference to bronchogenic carcinoma Cancer 1 634-56  LABORATORY DATA:  Lab Results  Component Value Date   HGB 14.7 05/24/2014   Lab Results  Component Value Date   NA 139 05/23/2014   K 4.3 05/23/2014   CL 97 05/23/2014   CO2 29 05/23/2014   No results found for: ALT, AST, GGT, ALKPHOS, BILITOT   RADIOGRAPHY: Nm Bone Scan Whole Body  Result  Date: 09/10/2017 CLINICAL DATA:  Prostate cancer, PSA = 4.48, question bone metastases EXAM: NUCLEAR MEDICINE WHOLE BODY BONE SCAN TECHNIQUE: Whole body anterior and posterior images were obtained approximately 3 hours after intravenous injection of radiopharmaceutical. RADIOPHARMACEUTICALS:  21.1 mCi Technetium-53mMDP IV COMPARISON:  None Radiographic correlation: CT abdomen pelvis 09/10/2017 FINDINGS: Uptake at the shoulders, feet, sternoclavicular joints, RIGHT knee, and LEFT wrist, typically degenerative. No sites of abnormal osseous tracer accumulation are identified which are suspicious for osseous metastases. Uptake at the lateral aspects of the cervical spine bilaterally typically degenerative. Expected urinary tract and soft tissue distribution of tracer. IMPRESSION: No scintigraphic evidence of osseous metastatic disease. Scattered degenerative type uptake as above. Electronically Signed   By: MLavonia DanaM.D.   On: 09/10/2017 18:46      IMPRESSION/PLAN: 1. 71y.o. gentleman with Stage T1c adenocarcinoma of the prostate with Gleason Score of 4+3, and PSA of 4.48. Dr. MTammi Klippeland I discussed the patient's workup and outlined the nature of prostate cancer in this setting. The patient's T stage, Gleason's score, and PSA put him into the intermediate risk group. Accordingly, he is eligible for a variety of potential treatment options including brachytherapy, 8 weeks of external radiation or 5 weeks of external radiation followed by a brachytherapy boost. We discussed the available radiation techniques, and focused on the details and logistics and delivery. We discussed and outlined the risks, benefits, short and long-term effects associated with radiotherapy and compared and contrasted these with prostatectomy. We discussed the role of SpaceOAR in reducing the rectal toxicity associated with radiotherapy. We also detailed the role of ADT in the treatment of unfavorable intermediate prostate cancer and  outlined the associated side effects that could be expected with this therapy.  He wishes to avoid ADT unless it is felt to be absolutely necessary.  At the conclusion of our conversation, the patient is interested in moving forward with brachytherapy and use of SpaceOAR to reduce rectal toxicity from radiotherapy.  We will share our discussion with Dr. MAlyson Inglesand move forward with [facility] in [facility] near future.  The patient met briefly with SRomie Jumperin our office who will be working  closely with him to coordinate OR scheduling and pre and post procedure appointments.  We will contact the pharmaceutical rep to ensure that [employer] is available at the time of procedure.  He will have a prostate MRI following his post-seed CT SIM to confirm appropriate distribution of the [place].    Manuel Johns, PA-C This document serves as a record of services personally performed by Freeman Caldron, PA-C and Tyler Pita, MD. It was created on their behalf by Valeta Harms, a trained medical scribe. The creation of this record is based on the scribe's personal observations and the providers' statements to them. This document has been checked and approved by the attending provider.

## 2017-09-11 NOTE — Progress Notes (Signed)
See progress note under physician encounter. 

## 2017-09-17 ENCOUNTER — Telehealth: Payer: Self-pay | Admitting: *Deleted

## 2017-09-17 NOTE — Telephone Encounter (Signed)
Called patient to inform of pre-seed appts. For 10-24-17 - arrival time - 12:45 pm, spoke with patient and he is aware of these appts.

## 2017-09-17 NOTE — Telephone Encounter (Signed)
xxxx 

## 2017-09-19 ENCOUNTER — Telehealth: Payer: Self-pay | Admitting: Medical Oncology

## 2017-09-19 NOTE — Telephone Encounter (Signed)
Called Manuel Bowers, spoke with wife to introduce myself as the prostate nurse navigator. I was unable to meet him the day he consulted with Dr. Tammi Klippel. He is scheduled for CT simulation 10/27/17 and they are aware of this appointment arriving at 12:45pm.

## 2017-09-24 ENCOUNTER — Ambulatory Visit: Payer: Medicare HMO

## 2017-10-01 DIAGNOSIS — H547 Unspecified visual loss: Secondary | ICD-10-CM | POA: Diagnosis not present

## 2017-10-01 DIAGNOSIS — H53452 Other localized visual field defect, left eye: Secondary | ICD-10-CM | POA: Diagnosis not present

## 2017-10-01 DIAGNOSIS — H401113 Primary open-angle glaucoma, right eye, severe stage: Secondary | ICD-10-CM | POA: Diagnosis not present

## 2017-10-01 DIAGNOSIS — H401123 Primary open-angle glaucoma, left eye, severe stage: Secondary | ICD-10-CM | POA: Diagnosis not present

## 2017-10-01 DIAGNOSIS — Z961 Presence of intraocular lens: Secondary | ICD-10-CM | POA: Diagnosis not present

## 2017-10-23 ENCOUNTER — Telehealth: Payer: Self-pay | Admitting: *Deleted

## 2017-10-23 NOTE — Telephone Encounter (Signed)
CALLED PATIENT TO REMIND OF PRE-SEED APPTS. FOR 10-24-17, LVM FOR A RETURN CALL

## 2017-10-24 ENCOUNTER — Ambulatory Visit
Admission: RE | Admit: 2017-10-24 | Discharge: 2017-10-24 | Disposition: A | Discharge status: Home / Self Care | Payer: Medicare HMO | Source: Ambulatory Visit | Attending: Radiation Oncology | Admitting: Radiation Oncology

## 2017-10-24 ENCOUNTER — Encounter: Payer: Self-pay | Admitting: Medical Oncology

## 2017-10-24 ENCOUNTER — Other Ambulatory Visit: Payer: Self-pay | Admitting: Urology

## 2017-10-24 DIAGNOSIS — I10 Essential (primary) hypertension: Secondary | ICD-10-CM | POA: Diagnosis not present

## 2017-10-24 DIAGNOSIS — Z510 Encounter for antineoplastic radiation therapy: Secondary | ICD-10-CM | POA: Diagnosis not present

## 2017-10-24 DIAGNOSIS — H409 Unspecified glaucoma: Secondary | ICD-10-CM | POA: Diagnosis not present

## 2017-10-24 DIAGNOSIS — Z87891 Personal history of nicotine dependence: Secondary | ICD-10-CM | POA: Diagnosis not present

## 2017-10-24 DIAGNOSIS — Z79899 Other long term (current) drug therapy: Secondary | ICD-10-CM | POA: Diagnosis not present

## 2017-10-24 DIAGNOSIS — C61 Malignant neoplasm of prostate: Secondary | ICD-10-CM | POA: Diagnosis not present

## 2017-10-24 DIAGNOSIS — Z7982 Long term (current) use of aspirin: Secondary | ICD-10-CM | POA: Diagnosis not present

## 2017-10-24 NOTE — Progress Notes (Signed)
  Radiation Oncology         [phone] ________________________________  Name: Manuel Bowers MRN: [id number]  Date: 10/24/2017  DOB: 04-17-1946  SIMULATION AND TREATMENT PLANNING NOTE PUBIC ARCH STUDY  AQ:TMAU, Dwyane Luo, MD  Alyson Ingles Candee Furbish, MD  DIAGNOSIS: 71 y.o. gentleman with Stage T1c adenocarcinoma of the prostate with Gleason Score of 4+3, and PSA of 4.48     ICD-10-CM   1. Malignant neoplasm of prostate ([place]) C61     COMPLEX SIMULATION:  The patient presented today for evaluation for possible prostate seed implant. He was brought to the radiation planning suite and placed supine on the CT couch. A 3-dimensional image study set was obtained in upload to the planning computer. There, on each axial slice, I contoured the prostate gland. Then, using three-dimensional radiation planning tools I reconstructed the prostate in view of the structures from the transperineal needle pathway to assess for possible pubic arch interference. In doing so, I did not appreciate any pubic arch interference. Also, the patient's prostate volume was estimated based on the drawn structure. The volume was 32 cc.  Given the pubic arch appearance and prostate volume, patient remains a good candidate to proceed with prostate seed implant. Today, he freely provided informed written consent to proceed.    PLAN: The patient will undergo prostate seed implant.   ________________________________  Sheral Apley. Tammi Klippel, M.D.      This document serves as a record of services personally performed by Tyler Pita MD. It was created on his behalf by Delton Coombes, a trained medical scribe. The creation of this record is based on the scribe's personal observations and the provider's statements to them.

## 2017-10-29 ENCOUNTER — Ambulatory Visit (HOSPITAL_COMMUNITY)
Admission: RE | Admit: 2017-10-29 | Discharge: 2017-10-29 | Disposition: A | Discharge status: Home / Self Care | Payer: Medicare HMO | Source: Ambulatory Visit | Attending: Urology | Admitting: Urology

## 2017-10-29 ENCOUNTER — Encounter (HOSPITAL_COMMUNITY)
Admission: RE | Admit: 2017-10-29 | Discharge: 2017-10-29 | Disposition: A | Discharge status: Home / Self Care | Payer: Medicare HMO | Source: Ambulatory Visit | Attending: Urology | Admitting: Urology

## 2017-10-29 ENCOUNTER — Encounter (INDEPENDENT_AMBULATORY_CARE_PROVIDER_SITE_OTHER): Payer: Self-pay

## 2017-10-29 DIAGNOSIS — Z8679 Personal history of other diseases of the circulatory system: Secondary | ICD-10-CM | POA: Diagnosis not present

## 2017-10-29 DIAGNOSIS — Z01818 Encounter for other preprocedural examination: Secondary | ICD-10-CM | POA: Diagnosis not present

## 2017-10-29 DIAGNOSIS — C61 Malignant neoplasm of prostate: Secondary | ICD-10-CM | POA: Diagnosis not present

## 2017-10-29 DIAGNOSIS — Z01810 Encounter for preprocedural cardiovascular examination: Secondary | ICD-10-CM | POA: Insufficient documentation

## 2017-11-07 ENCOUNTER — Other Ambulatory Visit: Payer: Self-pay | Admitting: Urology

## 2017-11-07 DIAGNOSIS — C61 Malignant neoplasm of prostate: Secondary | ICD-10-CM

## 2017-11-21 ENCOUNTER — Encounter (HOSPITAL_BASED_OUTPATIENT_CLINIC_OR_DEPARTMENT_OTHER): Payer: Self-pay

## 2017-12-02 ENCOUNTER — Other Ambulatory Visit: Payer: Self-pay

## 2017-12-02 ENCOUNTER — Encounter (HOSPITAL_BASED_OUTPATIENT_CLINIC_OR_DEPARTMENT_OTHER): Payer: Self-pay

## 2017-12-02 DIAGNOSIS — J449 Chronic obstructive pulmonary disease, unspecified: Secondary | ICD-10-CM | POA: Diagnosis not present

## 2017-12-02 DIAGNOSIS — C61 Malignant neoplasm of prostate: Secondary | ICD-10-CM | POA: Diagnosis not present

## 2017-12-02 NOTE — Progress Notes (Signed)
Spoke with: Inez Catalina (Mr. Fedie wife) NPO: No food after midnight/Clear liquids until 7:00AM DOS Arrival time: 11:00AM Labs: CBC, CMP, PT, PTT drawn 12/08/17, EKG CXR done 10/29/17 AM medications: Fleet enema, Eye drops, Simvastatin Pre op orders: Yes Ride home: Inez Catalina (wife) [phone]

## 2017-12-05 ENCOUNTER — Telehealth: Payer: Self-pay | Admitting: *Deleted

## 2017-12-05 NOTE — Telephone Encounter (Signed)
Called patient to remind of lab appt. On 12-08-17 @ 9 am @ [facility] , lvm for a return call

## 2017-12-08 ENCOUNTER — Encounter (HOSPITAL_COMMUNITY)
Admission: RE | Admit: 2017-12-08 | Discharge: 2017-12-08 | Disposition: A | Discharge status: Home / Self Care | Payer: Medicare HMO | Source: Ambulatory Visit | Attending: Urology | Admitting: Urology

## 2017-12-08 DIAGNOSIS — Z01812 Encounter for preprocedural laboratory examination: Secondary | ICD-10-CM | POA: Diagnosis not present

## 2017-12-08 LAB — COMPREHENSIVE METABOLIC PANEL
ALBUMIN: 4.5 g/dL (ref 3.5–5.0)
ALK PHOS: 63 U/L (ref 38–126)
ALT: 16 U/L — ABNORMAL LOW (ref 17–63)
AST: 19 U/L (ref 15–41)
Anion gap: 6 (ref 5–15)
BILIRUBIN TOTAL: 0.8 mg/dL (ref 0.3–1.2)
BUN: 24 mg/dL — AB (ref 6–20)
CALCIUM: 9.5 mg/dL (ref 8.9–10.3)
CO2: 28 mmol/L (ref 22–32)
Chloride: 106 mmol/L (ref 101–111)
Creatinine, Ser: 0.83 mg/dL (ref 0.61–1.24)
GFR calc Af Amer: >60 mL/min (ref >60)
GFR calc non Af Amer: >60 mL/min (ref >60)
GLUCOSE: 136 mg/dL — AB (ref 65–99)
POTASSIUM: 4.6 mmol/L (ref 3.5–5.1)
SODIUM: 140 mmol/L (ref 135–145)
Total Protein: 7.4 g/dL (ref 6.5–8.1)

## 2017-12-08 LAB — CBC
HEMATOCRIT: 40.6 % (ref 39.0–52.0)
HEMOGLOBIN: 13.6 g/dL (ref 13.0–17.0)
MCH: 28.5 pg (ref 26.0–34.0)
MCHC: 33.5 g/dL (ref 30.0–36.0)
MCV: 85.1 fL (ref 78.0–100.0)
Platelets: 198 10*3/uL (ref 150–400)
RBC: 4.77 MIL/uL (ref 4.22–5.81)
RDW: 12.6 % (ref 11.5–15.5)
WBC: 3.7 10*3/uL — AB (ref 4.0–10.5)

## 2017-12-08 LAB — APTT: APTT: 27 s (ref 24–36)

## 2017-12-08 LAB — PROTIME-INR
INR: 1.07
Prothrombin Time: 13.8 seconds (ref 11.4–15.2)

## 2017-12-12 ENCOUNTER — Telehealth: Payer: Self-pay | Admitting: *Deleted

## 2017-12-12 NOTE — Telephone Encounter (Signed)
CALLED PATIENT TO REMIND OF IMPLANT FOR 12-15-17, LVM FOR A RETURN CALL

## 2017-12-14 MED ORDER — DEXTROSE 5 % IV SOLN
5.0000 mg/kg | INTRAVENOUS | Status: DC
  Filled 2017-12-14: qty 9.5

## 2017-12-15 ENCOUNTER — Encounter (HOSPITAL_BASED_OUTPATIENT_CLINIC_OR_DEPARTMENT_OTHER): Payer: Self-pay

## 2017-12-15 ENCOUNTER — Ambulatory Visit (HOSPITAL_BASED_OUTPATIENT_CLINIC_OR_DEPARTMENT_OTHER): Payer: Medicare HMO | Admitting: Anesthesiology

## 2017-12-15 ENCOUNTER — Ambulatory Visit (HOSPITAL_BASED_OUTPATIENT_CLINIC_OR_DEPARTMENT_OTHER)
Admission: RE | Admit: 2017-12-15 | Discharge: 2017-12-15 | Disposition: A | Discharge status: Home / Self Care | Payer: Medicare HMO | Source: Ambulatory Visit | Attending: Urology | Admitting: Urology

## 2017-12-15 ENCOUNTER — Ambulatory Visit (HOSPITAL_COMMUNITY): Payer: Medicare HMO

## 2017-12-15 ENCOUNTER — Encounter (HOSPITAL_BASED_OUTPATIENT_CLINIC_OR_DEPARTMENT_OTHER)
Admission: RE | Disposition: A | Discharge status: Home / Self Care | Payer: Self-pay | Source: Ambulatory Visit | Attending: Urology

## 2017-12-15 DIAGNOSIS — I10 Essential (primary) hypertension: Secondary | ICD-10-CM | POA: Insufficient documentation

## 2017-12-15 DIAGNOSIS — M1990 Unspecified osteoarthritis, unspecified site: Secondary | ICD-10-CM | POA: Insufficient documentation

## 2017-12-15 DIAGNOSIS — N529 Male erectile dysfunction, unspecified: Secondary | ICD-10-CM | POA: Insufficient documentation

## 2017-12-15 DIAGNOSIS — C61 Malignant neoplasm of prostate: Secondary | ICD-10-CM | POA: Insufficient documentation

## 2017-12-15 DIAGNOSIS — K4090 Unilateral inguinal hernia, without obstruction or gangrene, not specified as recurrent: Secondary | ICD-10-CM | POA: Diagnosis not present

## 2017-12-15 DIAGNOSIS — Z87891 Personal history of nicotine dependence: Secondary | ICD-10-CM | POA: Diagnosis not present

## 2017-12-15 HISTORY — PX: RADIOACTIVE SEED IMPLANT: SHX5150

## 2017-12-15 HISTORY — DX: Personal history of other diseases of the digestive system: Z87.19

## 2017-12-15 HISTORY — PX: SPACE OAR INSTILLATION: SHX6769

## 2017-12-15 HISTORY — PX: CYSTOSCOPY: SHX5120

## 2017-12-15 SURGERY — INSERTION, RADIATION SOURCE, PROSTATE
Anesthesia: General

## 2017-12-15 MED ORDER — KETOROLAC TROMETHAMINE 30 MG/ML IJ SOLN
INTRAMUSCULAR | Status: DC | PRN
  Administered 2017-12-15: 30 mg via INTRAVENOUS

## 2017-12-15 MED ORDER — LIDOCAINE 2% (20 MG/ML) 5 ML SYRINGE
INTRAMUSCULAR | Status: AC
  Filled 2017-12-15: qty 5

## 2017-12-15 MED ORDER — ONDANSETRON HCL 4 MG/2ML IJ SOLN
INTRAMUSCULAR | Status: AC
  Filled 2017-12-15: qty 2

## 2017-12-15 MED ORDER — HYDROCODONE-ACETAMINOPHEN 5-325 MG PO TABS: 1.0000 | ORAL_TABLET | ORAL | 0 refills | Status: DC | PRN

## 2017-12-15 MED ORDER — SODIUM CHLORIDE 0.9 % IR SOLN
Status: DC | PRN
  Administered 2017-12-15: 1000 mL

## 2017-12-15 MED ORDER — FENTANYL CITRATE (PF) 100 MCG/2ML IJ SOLN
INTRAMUSCULAR | Status: DC | PRN
  Administered 2017-12-15: 50 ug via INTRAVENOUS
  Administered 2017-12-15 (×2): 25 ug via INTRAVENOUS

## 2017-12-15 MED ORDER — DEXAMETHASONE SODIUM PHOSPHATE 4 MG/ML IJ SOLN
INTRAMUSCULAR | Status: DC | PRN
  Administered 2017-12-15: 10 mg via INTRAVENOUS

## 2017-12-15 MED ORDER — IOHEXOL 300 MG/ML  SOLN
INTRAMUSCULAR | Status: DC | PRN
  Administered 2017-12-15: 7 mL

## 2017-12-15 MED ORDER — MEPERIDINE HCL 25 MG/ML IJ SOLN
6.2500 mg | INTRAMUSCULAR | Status: DC | PRN
  Filled 2017-12-15: qty 1

## 2017-12-15 MED ORDER — FENTANYL CITRATE (PF) 100 MCG/2ML IJ SOLN
INTRAMUSCULAR | Status: AC
  Filled 2017-12-15: qty 2

## 2017-12-15 MED ORDER — LIDOCAINE HCL (CARDIAC) 20 MG/ML IV SOLN
INTRAVENOUS | Status: DC | PRN
  Administered 2017-12-15: 60 mg via INTRAVENOUS

## 2017-12-15 MED ORDER — DEXAMETHASONE SODIUM PHOSPHATE 10 MG/ML IJ SOLN
INTRAMUSCULAR | Status: AC
  Filled 2017-12-15: qty 1

## 2017-12-15 MED ORDER — FLEET ENEMA 7-19 GM/118ML RE ENEM
1.0000 | ENEMA | Freq: Once | RECTAL | Status: DC
  Filled 2017-12-15: qty 1

## 2017-12-15 MED ORDER — PROMETHAZINE HCL 25 MG/ML IJ SOLN
6.2500 mg | INTRAMUSCULAR | Status: DC | PRN
  Filled 2017-12-15: qty 1

## 2017-12-15 MED ORDER — PROPOFOL 10 MG/ML IV BOLUS
INTRAVENOUS | Status: DC | PRN
  Administered 2017-12-15: 200 mg via INTRAVENOUS

## 2017-12-15 MED ORDER — ONDANSETRON HCL 4 MG/2ML IJ SOLN
INTRAMUSCULAR | Status: DC | PRN
  Administered 2017-12-15: 4 mg via INTRAVENOUS

## 2017-12-15 MED ORDER — MIDAZOLAM HCL 2 MG/2ML IJ SOLN
0.5000 mg | Freq: Once | INTRAMUSCULAR | Status: DC | PRN
  Filled 2017-12-15: qty 2

## 2017-12-15 MED ORDER — LACTATED RINGERS IV SOLN
INTRAVENOUS | Status: DC
  Administered 2017-12-15: 1000 mL via INTRAVENOUS
  Filled 2017-12-15: qty 1000

## 2017-12-15 MED ORDER — PROPOFOL 10 MG/ML IV BOLUS
INTRAVENOUS | Status: AC
  Filled 2017-12-15: qty 40

## 2017-12-15 MED ORDER — GENTAMICIN SULFATE 40 MG/ML IJ SOLN
420.0000 mg | Freq: Once | INTRAVENOUS | Status: AC
  Administered 2017-12-15: 420 mg via INTRAVENOUS
  Filled 2017-12-15 (×2): qty 10.5

## 2017-12-15 MED ORDER — FENTANYL CITRATE (PF) 100 MCG/2ML IJ SOLN
25.0000 ug | INTRAMUSCULAR | Status: DC | PRN
  Filled 2017-12-15: qty 1

## 2017-12-15 SURGICAL SUPPLY — 27 items
BAG URINE DRAINAGE (UROLOGICAL SUPPLIES) ×4 IMPLANT
BLADE CLIPPER SURG (BLADE) ×4 IMPLANT
CATH FOLEY 2WAY SLVR  5CC 16FR (CATHETERS) ×4
CATH FOLEY 2WAY SLVR 5CC 16FR (CATHETERS) ×4 IMPLANT
CATH ROBINSON RED A/P 20FR (CATHETERS) ×4 IMPLANT
CLOTH BEACON ORANGE TIMEOUT ST (SAFETY) ×4 IMPLANT
COVER BACK TABLE 60X90IN (DRAPES) ×4 IMPLANT
COVER MAYO STAND STRL (DRAPES) ×4 IMPLANT
DRSG TEGADERM 4X4.75 (GAUZE/BANDAGES/DRESSINGS) ×6 IMPLANT
DRSG TEGADERM 8X12 (GAUZE/BANDAGES/DRESSINGS) ×6 IMPLANT
GAUZE SPONGE 4X4 12PLY STRL (GAUZE/BANDAGES/DRESSINGS) ×2 IMPLANT
GLOVE BIO SURGEON STRL SZ8 (GLOVE) ×4 IMPLANT
GLOVE ECLIPSE 8.0 STRL XLNG CF (GLOVE) ×4 IMPLANT
GOWN STRL REUS W/ TWL XL LVL3 (GOWN DISPOSABLE) ×2 IMPLANT
GOWN STRL REUS W/TWL XL LVL3 (GOWN DISPOSABLE) ×8 IMPLANT
IMPL SPACEOAR SYSTEM 10ML (MISCELLANEOUS) ×2 IMPLANT
IMPLANT SPACEOAR SYSTEM 10ML (MISCELLANEOUS) ×4
IV NS 1000ML (IV SOLUTION) ×4
IV NS 1000ML BAXH (IV SOLUTION) ×2 IMPLANT
KIT RM TURNOVER CYSTO AR (KITS) ×4 IMPLANT
PACK CYSTO (CUSTOM PROCEDURE TRAY) ×4 IMPLANT
SURGILUBE 2OZ TUBE FLIPTOP (MISCELLANEOUS) ×4 IMPLANT
SUT BONE WAX W31G (SUTURE) ×4 IMPLANT
SYRINGE 10CC LL (SYRINGE) ×8 IMPLANT
UNDERPAD 30X30 (UNDERPADS AND DIAPERS) ×8 IMPLANT
WATER STERILE IRR 500ML POUR (IV SOLUTION) ×4 IMPLANT
selectSeed I-125 ×164 IMPLANT

## 2017-12-15 NOTE — H&P (Signed)
Urology Admission H&P  Chief Complaint: prostate cancer  History of Present Illness: [name] is a 72yo with a hx of T1c prostate cancer here for brachytherapy. He denies any significant LUTS. No hematuria or dysuria. No fevers/chills/sweats  Past Medical History:  Diagnosis Date  . Anisocoria   . Arthritis   . ED (erectile dysfunction)   . Elevated lipids   . Fluid in knee   . Glaucoma   . History of inguinal hernia    left  . Hypertension   . Prostate cancer ([place])   . Wears glasses    Past Surgical History:  Procedure Laterality Date  . COLONOSCOPY    . EYE SURGERY  2004   lt eye for glaucoma  . INGUINAL HERNIA REPAIR Left 05/24/2014   Procedure: LEFT INGUINAL HERNIA REPAIR ;  Surgeon: [name]. Georgette Dover, MD;  Location: [facility];  Service: General;  Laterality: Left;  . PROSTATE BIOPSY      Home Medications:  Current Facility-Administered Medications  Medication Dose Route Frequency Provider Last Rate Last Dose  . gentamicin (GARAMYCIN) 420 mg in dextrose 5 % 100 mL IVPB  420 mg Intravenous Once Cleon Gustin, MD      . lactated ringers infusion   Intravenous Continuous Janeece Riggers, MD 50 mL/hr at 12/15/17 1137 1,000 mL at 12/15/17 1137  . [START ON 12/16/2017] sodium phosphate (FLEET) 7-19 GM/118ML enema 1 enema  1 enema Rectal Once Kari Kerth, Candee Furbish, MD       Allergies: No Known Allergies  Family History  Problem Relation Age of Onset  . Cancer Neg Hx    Social History:  reports that he quit smoking about 54 years ago. His smoking use included cigarettes. He has a 0.50 pack-year smoking history. he has never used smokeless tobacco. He reports that he does not drink alcohol or use drugs.  Review of Systems  All other systems reviewed and are negative.   Physical Exam:  Vital signs in last 24 hours: Temp:  [98.4 F (36.9 C)] 98.4 F (36.9 C) (02/11 1039) Pulse Rate:  [60] 60 (02/11 1039) Resp:  [18] 18 (02/11 1039) BP: (132)/(99)  132/99 (02/11 1039) SpO2:  [100 %] 100 % (02/11 1039) Weight:  [84.2 kg (185 lb 11.2 oz)] 84.2 kg (185 lb 11.2 oz) (02/11 1039) Physical Exam  Constitutional: He is oriented to person, place, and time. He appears well-developed and well-nourished.  HENT:  Head: Normocephalic and atraumatic.  Eyes: EOM are normal. Pupils are equal, round, and reactive to light.  Neck: Normal range of motion. No thyromegaly present.  Cardiovascular: Normal rate and regular rhythm.  Respiratory: Effort normal. No respiratory distress.  GI: Soft. He exhibits no distension.  Musculoskeletal: Normal range of motion. He exhibits no edema.  Neurological: He is alert and oriented to person, place, and time.  Skin: Skin is warm and dry.  Psychiatric: He has a normal mood and affect. His behavior is normal. Judgment and thought content normal.    Laboratory Data:  No results found for this or any previous visit (from the past 24 hour(s)). No results found for this or any previous visit (from the past 240 hour(s)). Creatinine: No results for input(s): CREATININE in the last 168 hours. Baseline Creatinine: unknown  Impression/Assessment:  71yo with T1c prostate cancer  Plan:  The risks/benefits/alternatives to brachytherapy with SpaceOAR was explained to the patient and he understands and wishes to proceed with surgery  Nicolette Bang 12/15/2017, 1:26 PM

## 2017-12-15 NOTE — Anesthesia Procedure Notes (Signed)
Procedure Name: LMA Insertion Date/Time: 12/15/2017 1:46 PM Performed by: Justice Rocher, CRNA Pre-anesthesia Checklist: Patient identified, Emergency Drugs available, Suction available and Patient being monitored Patient Re-evaluated:Patient Re-evaluated prior to induction Oxygen Delivery Method: Circle system utilized Preoxygenation: Pre-oxygenation with 100% oxygen Induction Type: IV induction Ventilation: Mask ventilation without difficulty LMA: LMA inserted LMA Size: 5.0 Number of attempts: 1 Airway Equipment and Method: Bite block Placement Confirmation: positive ETCO2 and breath sounds checked- equal and bilateral Tube secured with: Tape Dental Injury: Teeth and Oropharynx as per pre-operative assessment

## 2017-12-15 NOTE — Transfer of Care (Signed)
Immediate Anesthesia Transfer of Care Note  Patient: Manuel Bowers  Procedure(s) Performed: Procedure(s) (LRB): RADIOACTIVE SEED IMPLANT/BRACHYTHERAPY IMPLANT (N/A) SPACE OAR INSTILLATION (N/A) CYSTOSCOPY  Patient Location: PACU  Anesthesia Type: General  Level of Consciousness: awake, sedated, patient cooperative and responds to stimulation  Airway & Oxygen Therapy: Patient Spontanous Breathing and Patient connected to NCO2  Post-op Assessment: Report given to PACU RN, Post -op Vital signs reviewed and stable and Patient moving all extremities  Post vital signs: Reviewed and stable  Complications: No apparent anesthesia complications

## 2017-12-15 NOTE — Op Note (Signed)
PRE-OPERATIVE DIAGNOSIS:  Adenocarcinoma of the prostate  POST-OPERATIVE DIAGNOSIS:  Same  PROCEDURE:  Procedure(s): 1. I-125 radioactive seed implantation 2. Cystoscopy 3. Placement of Space OAR  SURGEON:  Surgeon(s): Nicolette Bang, MD  Radiation oncologist: Dr. Tyler Pita  ANESTHESIA:  General  EBL:  Minimal  DRAINS: 75 French Foley catheter  INDICATION: Manuel Bowers is a 72 year old with a history of T1c prostate cancer. After discussing treatment options he has elected to proceed with brachytherapy and SpaceOAR placement  Description of procedure: After informed consent the patient was brought to the major OR, placed on the table and administered general anesthesia. He was then moved to the modified lithotomy position with his perineum perpendicular to the floor. His perineum and genitalia were then sterilely prepped. An official timeout was then performed. A 16 French Foley catheter was then placed in the bladder and filled with dilute contrast, a rectal tube was placed in the rectum and the transrectal ultrasound probe was placed in the rectum and affixed to the stand. He was then sterilely draped.  Real time ultrasonography was used along with the seed planning software [employer] Pro[employer] vs. 4.2.21. This was used to develop the seed plan including the number of needles as well as number of seeds required for complete and adequate coverage. Real-time ultrasonography was then used along with the previously developed plan and the Nucletron device to implant a total of 82 seeds using 23 needles. This proceeded without difficulty or complication. We then proceeded to mix the SpaceOAR using the kit supplied from the manufacturer. Once this was complete we placed a sinal needle into the perirectal fat between the rectum and the prostate. Once this was accomplished we injected 2cc of normal saline to hydrodissect the plain. We then instilled the the SpaceOAR through the spinal needle  and noted good distribution in the perirectal fat.  The patient was awakened and taken to recovery room in stable and satisfactory condition. He tolerated procedure well and there were no intraoperative complications. A Foley catheter was then removed as well as the transrectal ultrasound probe and rectal probe. Flexible cystoscopy was then performed using the 17 French flexible scope which revealed a normal urethra throughout its length down to the sphincter which appeared intact. The prostatic urethra revealed bilobar hypertrophy but no evidence of obstruction, seeds, spacers or lesions. The bladder was then entered and fully and systematically inspected. The ureteral orifices were noted to be of normal configuration and position. The mucosa revealed no evidence of tumors. There were also no stones identified within the bladder. I noted no seeds or spacers on the floor of the bladder and retroflexion of the scope revealed no seeds protruding from the base of the prostate.  The cystoscope was then removed and a new 2 French Foley catheter was then inserted and the balloon was filled with 10 cc of sterile water. This was connected to closed system drainage and the patient was awakened and taken to recovery room in stable and satisfactory condition. He tolerated procedure well and there were no intraoperative complications.

## 2017-12-15 NOTE — Progress Notes (Signed)
  Radiation Oncology         [phone] ________________________________  Name: Manuel Bowers MRN: [id number]  Date: 12/15/2017  DOB: 07-Sep-1946       Prostate Seed Implant  DG:LOVF, Manuel Luo, MD  No ref. provider found  DIAGNOSIS: 72 y.o. gentleman with Stage T1c adenocarcinoma of the prostate with Gleason Score of 4+3, and PSA of 4.48  PROCEDURE: Insertion of radioactive I-125 seeds into the prostate gland.  RADIATION DOSE: 145 Gy, definitive therapy.  TECHNIQUE: Manuel Bowers was brought to the operating room with the urologist. He was placed in the dorsolithotomy position. He was catheterized and a rectal tube was inserted. The perineum was shaved, prepped and draped. The ultrasound probe was then introduced into the rectum to see the prostate gland.  TREATMENT DEVICE: A needle grid was attached to the ultrasound probe stand and anchor needles were placed.  3D PLANNING: The prostate was imaged in 3D using a sagittal sweep of the prostate probe. These images were transferred to the planning computer. There, the prostate, urethra and rectum were defined on each axial reconstructed image. Then, the software created an optimized 3D plan and a few seed positions were adjusted. The quality of the plan was reviewed using [facility] information for the target and the following two organs at risk:  Urethra and Rectum.  Then the accepted plan was uploaded to the seed Selectron afterloading unit.  PROSTATE VOLUME STUDY:  Using transrectal ultrasound the volume of the prostate was verified to be 45.9 cc.  SPECIAL TREATMENT PROCEDURE/SUPERVISION AND HANDLING: The Nucletron FIRST system was used to place the needles under sagittal guidance. A total of 23 needles were used to deposit 82 seeds in the prostate gland. The individual seed activity was 0.407 mCi.  SpaceOAR:  Yes  COMPLEX SIMULATION: At the end of the procedure, an anterior radiograph of the pelvis was obtained to document seed positioning  and count. Cystoscopy was performed to check the urethra and bladder.  MICRODOSIMETRY: At the end of the procedure, the patient was emitting 0.23 mR/hr at 1 meter. Accordingly, he was considered safe for hospital discharge.  PLAN: The patient will return to the radiation oncology clinic for post implant CT dosimetry in three weeks.   ________________________________  Manuel Bowers, M.D.

## 2017-12-15 NOTE — Anesthesia Preprocedure Evaluation (Signed)
Anesthesia Evaluation  Patient identified by MRN, date of birth, ID band Patient awake    Reviewed: Allergy & Precautions, NPO status , Patient's Chart, lab work & pertinent test results  History of Anesthesia Complications Negative for: history of anesthetic complications  Airway Mallampati: I  TM Distance: >3 FB Neck ROM: Full    Dental  (+) Caps, Dental Advisory Given   Pulmonary former smoker,    breath sounds clear to auscultation       Cardiovascular hypertension, Pt. on medications (-) angina Rhythm:Regular Rate:Normal     Neuro/Psych glaucoma    GI/Hepatic negative GI ROS, Neg liver ROS,   Endo/Other  negative endocrine ROS  Renal/GU negative Renal ROS     Musculoskeletal  (+) Arthritis ,   Abdominal   Peds  Hematology   Anesthesia Other Findings   Reproductive/Obstetrics                             Anesthesia Physical Anesthesia Plan  ASA: II  Anesthesia Plan: General   Post-op Pain Management:    Induction: Intravenous  PONV Risk Score and Plan: 2 and Ondansetron and Dexamethasone  Airway Management Planned: LMA  Additional Equipment:   Intra-op Plan:   Post-operative Plan:   Informed Consent: I have reviewed the patients History and Physical, chart, labs and discussed the procedure including the risks, benefits and alternatives for the proposed anesthesia with the patient or authorized representative who has indicated his/her understanding and acceptance.   Dental advisory given  Plan Discussed with: CRNA and Surgeon  Anesthesia Plan Comments: (Plan routine monitors, GA- LMA OK)        Anesthesia Quick Evaluation

## 2017-12-15 NOTE — Discharge Instructions (Signed)
Indwelling Urinary Catheter Care, Adult Take good care of your catheter to keep it working and to prevent problems. How to wear your catheter Attach your catheter to your leg with tape (adhesive tape) or a leg strap. Make sure it is not too tight. If you use tape, remove any bits of tape that are already on the catheter. How to wear a drainage bag You should have:  A large overnight bag.  A small leg bag.  Overnight Bag You may wear the overnight bag at any time. Always keep the bag below the level of your bladder but off the floor. When you sleep, put a clean plastic bag in a wastebasket. Then hang the bag inside the wastebasket. Leg Bag Never wear the leg bag at night. Always wear the leg bag below your knee. Keep the leg bag secure with a leg strap or tape. How to care for your skin  Clean the skin around the catheter at least once every day.  Shower every day. Do not take baths.  Put creams, lotions, or ointments on your genital area only as told by your doctor.  Do not use powders, sprays, or lotions on your genital area. How to clean your catheter and your skin 1. Wash your hands with soap and water. 2. Wet a washcloth in warm water and gentle (mild) soap. 3. Use the washcloth to clean the skin where the catheter enters your body. Clean downward and wipe away from the catheter in small circles. Do not wipe toward the catheter. 4. Pat the area dry with a clean towel. Make sure to clean off all soap. How to care for your drainage bags Empty your drainage bag when it is ?- full or at least 2-3 times a day. Replace your drainage bag once a month or sooner if it starts to smell bad or look dirty. Do not clean your drainage bag unless told by your doctor. Emptying a drainage bag  Supplies Needed  Rubbing alcohol.  Gauze pad or cotton ball.  Tape or a leg strap.  Steps 1. Wash your hands with soap and water. 2. Separate (detach) the bag from your leg. 3. Hold the bag over  the toilet or a clean container. Keep the bag below your hips and bladder. This stops pee (urine) from going back into the tube. 4. Open the pour spout at the bottom of the bag. 5. Empty the pee into the toilet or container. Do not let the pour spout touch any surface. 6. Put rubbing alcohol on a gauze pad or cotton ball. 7. Use the gauze pad or cotton ball to clean the pour spout. 8. Close the pour spout. 9. Attach the bag to your leg with tape or a leg strap. 10. Wash your hands.  Changing a drainage bag Supplies Needed  Alcohol wipes.  A clean drainage bag.  Adhesive tape or a leg strap.  Steps 1. Wash your hands with soap and water. 2. Separate the dirty bag from your leg. 3. Pinch the rubber catheter with your fingers so that pee does not spill out. 4. Separate the catheter tube from the drainage tube where these tubes connect (at the connection valve). Do not let the tubes touch any surface. 5. Clean the end of the catheter tube with an alcohol wipe. Use a different alcohol wipe to clean the end of the drainage tube. 6. Connect the catheter tube to the drainage tube of the clean bag. 7. Attach the new bag to  the leg with adhesive tape or a leg strap. 8. Wash your hands.  How to prevent infection and other problems  Never pull on your catheter or try to remove it. Pulling can damage tissue in your body.  Always wash your hands before and after touching your catheter.  If a leg strap gets wet, replace it with a dry one.  Drink enough fluids to keep your pee clear or pale yellow, or as told by your doctor.  Do not let the drainage bag or tubing touch the floor.  Wear cotton underwear.  If you are male, wipe from front to back after you poop (have a bowel movement).  Check on the catheter often to make sure it works and the tubing is not twisted. Get help if:  Your pee is cloudy.  Your pee smells unusually bad.  Your pee is not draining into the bag.  Your  tube gets clogged.  Your catheter starts to leak.  Your bladder feels full. Get help right away if:  You have redness, swelling, or pain where the catheter enters your body.  You have fluid, pus, or a bad smell coming from the area where the catheter enters your body.  The area where the catheter enters your body feels warm.  You have a fever.  You have pain in your: ? Stomach (abdomen). ? Legs. ? Lower back. ? Bladder.  You see blood fill the catheter.  Your pee is pink or red.  You feel sick to your stomach (nauseous).  You throw up (vomit).  You have chills.  Your catheter gets pulled out. This information is not intended to replace advice given to you by your health care provider. Make sure you discuss any questions you have with your health care provider. Document Released: 02/15/2013 Document Revised: 09/18/2016 Document Reviewed: 04/05/2014 Elsevier Interactive Patient Education  2018 [place]  Radioactive Seed Implant Home Care Instructions   Activity:  Rest for the remainder of the day.  Do not drive or operate equipment today.  You may resume normal activities in a few days as instructed by your physician, without risk of harmful radiation exposure to those around you, provided you follow the time and distance precautions on the Radiation Oncology Instruction Sheet.   Meals: Drink plenty of lipuids and eat light foods, such as gelatin or soup this evening .  You may return to normal meal plan tomorrow.  Return To Work: You may return to work as instructed by [employer].  Special Instruction: If any seeds are found, use tweezers to pick up seeds and place in a glass container of any kind and bring to your physician's office.  Call your physician if any of these symptoms occur:   Persistent or heavy bleeding  Urine stream diminishes or stops completely after catheter is removed  Fever equal to or greater than 101 degrees F  Cloudy urine  with a strong foul odor  Severe pain  You may feel some burning pain and/or hesitancy when you urinate after the catheter is removed.  These symptoms may increase over the next few weeks, but should diminish within forur to six weeks.  Applying moist heat to the lower abdomen or a hot tub bath may help relieve the pain.  If the discomfort becomes severe, please call your physician for additional medications.   Post Anesthesia Home Care Instructions  Activity: Get plenty of rest for the remainder of the day. A responsible individual must stay with you  for 24 hours following the procedure.  For the next 24 hours, DO NOT: -Drive a car -Paediatric nurse -Drink alcoholic beverages -Take any medication unless instructed by your physician -Make any legal decisions or sign important papers.  Meals: Start with liquid foods such as gelatin or soup. Progress to regular foods as tolerated. Avoid greasy, spicy, heavy foods. If nausea and/or vomiting occur, drink only clear liquids until the nausea and/or vomiting subsides. Call your physician if vomiting continues.  Special Instructions/Symptoms: Your throat may feel dry or sore from the anesthesia or the breathing tube placed in your throat during surgery. If this causes discomfort, gargle with warm salt water. The discomfort should disappear within 24 hours.

## 2017-12-16 ENCOUNTER — Encounter (HOSPITAL_BASED_OUTPATIENT_CLINIC_OR_DEPARTMENT_OTHER): Payer: Self-pay | Admitting: Urology

## 2017-12-17 NOTE — Anesthesia Postprocedure Evaluation (Signed)
Anesthesia Post Note  Patient: Manuel Bowers  Procedure(s) Performed: RADIOACTIVE SEED IMPLANT/BRACHYTHERAPY IMPLANT (N/A ) SPACE OAR INSTILLATION (N/A ) CYSTOSCOPY     Patient location during evaluation: PACU Anesthesia Type: General Level of consciousness: awake and alert Pain management: pain level controlled Vital Signs Assessment: post-procedure vital signs reviewed and stable Respiratory status: spontaneous breathing, nonlabored ventilation, respiratory function stable and patient connected to nasal cannula oxygen Cardiovascular status: blood pressure returned to baseline and stable Postop Assessment: no apparent nausea or vomiting Anesthetic complications: no    Last Vitals:  Vitals:   12/15/17 1545 12/15/17 1715  BP: (!) 175/88 (!) 152/88  Pulse: (!) 51 63  Resp: 17 18  Temp:  (!) 36.3 C  SpO2: 100% 100%    Last Pain:  Vitals:   12/16/17 1229  TempSrc:   PainSc: 0-No pain                 Taylinn Brabant S

## 2017-12-22 DIAGNOSIS — C61 Malignant neoplasm of prostate: Secondary | ICD-10-CM | POA: Diagnosis not present

## 2017-12-22 DIAGNOSIS — I10 Essential (primary) hypertension: Secondary | ICD-10-CM | POA: Diagnosis not present

## 2017-12-22 DIAGNOSIS — H409 Unspecified glaucoma: Secondary | ICD-10-CM | POA: Diagnosis not present

## 2017-12-22 DIAGNOSIS — M109 Gout, unspecified: Secondary | ICD-10-CM | POA: Diagnosis not present

## 2017-12-22 DIAGNOSIS — Z0000 Encounter for general adult medical examination without abnormal findings: Secondary | ICD-10-CM | POA: Diagnosis not present

## 2017-12-22 DIAGNOSIS — M179 Osteoarthritis of knee, unspecified: Secondary | ICD-10-CM | POA: Diagnosis not present

## 2017-12-22 DIAGNOSIS — E782 Mixed hyperlipidemia: Secondary | ICD-10-CM | POA: Diagnosis not present

## 2017-12-25 ENCOUNTER — Telehealth: Payer: Self-pay | Admitting: *Deleted

## 2017-12-25 NOTE — Telephone Encounter (Signed)
CALLED PATIENT TO REMIND OF POST SEED APPTS. FOR 12-26-17, LVM FOR A RETURN CALL

## 2017-12-26 ENCOUNTER — Ambulatory Visit
Admission: RE | Admit: 2017-12-26 | Discharge: 2017-12-26 | Disposition: A | Discharge status: Home / Self Care | Payer: Medicare HMO | Source: Ambulatory Visit | Attending: Radiation Oncology | Admitting: Radiation Oncology

## 2017-12-26 ENCOUNTER — Encounter: Payer: Self-pay | Admitting: Radiation Oncology

## 2017-12-26 ENCOUNTER — Other Ambulatory Visit: Payer: Self-pay

## 2017-12-26 ENCOUNTER — Ambulatory Visit (HOSPITAL_COMMUNITY): Admission: RE | Admit: 2017-12-26 | Payer: Medicare HMO | Source: Ambulatory Visit

## 2017-12-26 VITALS — BP 141/93 | HR 77 | Resp 18 | Wt 189.0 lb

## 2017-12-26 DIAGNOSIS — R3915 Urgency of urination: Secondary | ICD-10-CM | POA: Diagnosis not present

## 2017-12-26 DIAGNOSIS — Z7982 Long term (current) use of aspirin: Secondary | ICD-10-CM | POA: Diagnosis not present

## 2017-12-26 DIAGNOSIS — Z923 Personal history of irradiation: Secondary | ICD-10-CM | POA: Insufficient documentation

## 2017-12-26 DIAGNOSIS — R350 Frequency of micturition: Secondary | ICD-10-CM | POA: Insufficient documentation

## 2017-12-26 DIAGNOSIS — C61 Malignant neoplasm of prostate: Secondary | ICD-10-CM | POA: Insufficient documentation

## 2017-12-26 DIAGNOSIS — Z79899 Other long term (current) drug therapy: Secondary | ICD-10-CM | POA: Insufficient documentation

## 2017-12-26 NOTE — Progress Notes (Signed)
Patient returns today for post seed follow up. Weight and vitals stable. Denies pain. Pre seed IPSS 11. Post seed IPSS 20. Scheduled to follow up with urologist on Monday. MRI for Spaceoar changed from today to Tuesday because machine is down. Reports dysuria is improving. Denies hematuria. Reports an occasional urinary dribble if he attempts to hold his urine too long.   BP (!) 141/93 (BP Location: Left Arm, Patient Position: Sitting, Cuff Size: Normal)   Pulse 77   Resp 18   Wt 189 lb (85.7 kg)   SpO2 100%   BMI 28.74 kg/m  Wt Readings from Last 3 Encounters:  12/26/17 189 lb (85.7 kg)  12/15/17 185 lb 11.2 oz (84.2 kg)  09/11/17 193 lb 12.8 oz (87.9 kg)

## 2017-12-26 NOTE — Progress Notes (Signed)
Radiation Oncology         [phone] ________________________________  Name: Manuel Bowers MRN: [id number]  Date: 12/26/2017  DOB: [birth year]  Post-Seed Follow-Up Visit Note  CC: Lawerance Cruel, MD  Cleon Gustin, MD  Diagnosis:   72 y.o. gentleman with Stage T1c adenocarcinoma of the prostate with Gleason Score of 4+3, and PSA of 4.48    ICD-10-CM   1. Malignant neoplasm of prostate ([place]) C61     Interval Since Last Radiation:  11 days 12/15/17:  Insertion of radioactive I-125 seeds into the prostate gland; 145 Gy, definitive therapy.  Narrative:  The patient returns today for routine follow-up.  He is complaining of increased urinary frequency, urgency and urinary hesitation symptoms. He filled out a questionnaire regarding urinary function today providing and overall IPSS score of 20, characterizing his symptoms as moderate-severe.  His pre-implant score was 11. He denies any bowel symptoms or pain. He has a scheduled follow up with Dr. Alyson Ingles on Monday 12/29/17. His prostate MRI for Spaceoar verification was rescheduled from today to Tuesday 12/30/17 because machine is down currently. He reports that the dysuria is gradually improving as well as the force of stream and intermittency. He denies gross hematuria. He reports an occasional urinary dribble if he attempts to hold his urine too long but otherwise denies post void dribble or incontinence.  He has not noticed any significant impact on his energy level.  Overall, he is pleased with his progress to date.  ALLERGIES:  has No Known Allergies.  Meds: Current Outpatient Medications  Medication Sig Dispense Refill  . Artificial Tear Solution (SOOTHE XP OP) Apply to eye.    Marland Kitchen aspirin 81 MG tablet Take 81 mg by mouth daily.    . brimonidine (ALPHAGAN) 0.2 % ophthalmic solution Place 1 drop into both eyes 3 (three) times daily.     . Dorzolamide HCl-Timolol Mal PF 22.3-6.8 MG/ML SOLN Apply 1 drop to eye 2 (two) times daily.      . hydrochlorothiazide (HYDRODIURIL) 25 MG tablet Take 25 mg by mouth daily.    Marland Kitchen HYDROcodone-acetaminophen (NORCO/VICODIN) 5-325 MG tablet Take 1 tablet by mouth every 4 (four) hours as needed for severe pain. 15 tablet 0  . ibuprofen (ADVIL,MOTRIN) 200 MG tablet Take 200 mg by mouth every 8 (eight) hours as needed for moderate pain.    Marland Kitchen latanoprost (XALATAN) 0.005 % ophthalmic solution Place 1 drop into both eyes at bedtime.    . simvastatin (ZOCOR) 40 MG tablet Take 40 mg by mouth daily.     No current facility-administered medications for this encounter.     Physical Findings: In general this is a well appearing African-American male in no acute distress.  He's alert and oriented x4 and appropriate throughout the examination. Cardiopulmonary assessment is negative for acute distress and he exhibits normal effort.   Lab Findings: Lab Results  Component Value Date   WBC 3.7 (L) 12/08/2017   HGB 13.6 12/08/2017   HCT 40.6 12/08/2017   MCV 85.1 12/08/2017   PLT 198 12/08/2017    Radiographic Findings:  Patient underwent CT imaging in our clinic for post implant dosimetry. The CT was reviewed by Dr. Tammi Klippel and appears to demonstrate an adequate distribution of radioactive seeds throughout the prostate gland. There are no seeds in or near the rectum. He is scheduled for MRI prostate to verify [place] distribution on 12/30/16 and this will be fused with today's CT scan for further assessment.  We suspect the  final radiation plan and dosimetry will show appropriate coverage of the prostate gland.   Impression/Plan: The patient is recovering from the effects of radiation. His urinary symptoms should gradually improve over the next 4-6 months. We talked about this today. He is encouraged by his improvement already and is otherwise pleased with his outcome. We also talked about long-term follow-up for prostate cancer following seed implant. He has a follow up visit with Dr. Alyson Ingles on  12/29/17. He understands that ongoing PSA determinations and digital rectal exams will help perform surveillance to rule out disease recurrence. He understands what to expect with his PSA measures. Patient was also educated today about some of the long-term effects from radiation including a small risk for rectal bleeding and possibly erectile dysfunction. We talked about some of the general management approaches to these potential complications. However, I did encourage the patient to contact our office or return at any point if he has questions or concerns related to his previous radiation and prostate cancer.    Nicholos Johns, PA-C    Tyler Pita, MD  [facility] Direct Dial: [phone]  Fax: [phone] [email or web address]  Skype  [email or web address]

## 2017-12-26 NOTE — Progress Notes (Signed)
  Radiation Oncology         [phone] ________________________________  Name: Manuel Bowers MRN: [id number]  Date: 12/26/2017  DOB: 1946/04/25  COMPLEX SIMULATION NOTE  NARRATIVE:  The patient was brought to the [facility] today following prostate seed implantation approximately one month ago.  Identity was confirmed.  All relevant records and images related to the planned course of therapy were reviewed.  Then, the patient was set-up supine.  CT images were obtained.  The CT images were loaded into the planning software.  Then the prostate and rectum were contoured.  Treatment planning then occurred.  The implanted iodine 125 seeds were identified by the physics staff for projection of radiation distribution  I have requested : 3D Simulation  I have requested a DVH of the following structures: Prostate and rectum.    ________________________________  Sheral Apley Tammi Klippel, M.D.

## 2017-12-29 DIAGNOSIS — C61 Malignant neoplasm of prostate: Secondary | ICD-10-CM | POA: Diagnosis not present

## 2017-12-30 ENCOUNTER — Ambulatory Visit (HOSPITAL_COMMUNITY)
Admission: RE | Admit: 2017-12-30 | Discharge: 2017-12-30 | Disposition: A | Discharge status: Home / Self Care | Payer: Medicare HMO | Source: Ambulatory Visit | Attending: Urology | Admitting: Urology

## 2017-12-30 DIAGNOSIS — C61 Malignant neoplasm of prostate: Secondary | ICD-10-CM | POA: Diagnosis not present

## 2018-01-01 ENCOUNTER — Other Ambulatory Visit (HOSPITAL_COMMUNITY): Payer: Medicare HMO

## 2018-01-13 DIAGNOSIS — R3911 Hesitancy of micturition: Secondary | ICD-10-CM | POA: Diagnosis not present

## 2018-01-14 ENCOUNTER — Ambulatory Visit: Payer: Medicare HMO | Attending: Radiation Oncology | Admitting: Radiation Oncology

## 2018-01-14 DIAGNOSIS — Z510 Encounter for antineoplastic radiation therapy: Secondary | ICD-10-CM | POA: Insufficient documentation

## 2018-01-14 DIAGNOSIS — C61 Malignant neoplasm of prostate: Secondary | ICD-10-CM | POA: Diagnosis not present

## 2018-01-17 ENCOUNTER — Encounter: Payer: Self-pay | Admitting: Radiation Oncology

## 2018-01-17 NOTE — Progress Notes (Signed)
  Radiation Oncology         [phone] ________________________________  Name: Manuel Bowers MRN: [id number]  Date: 01/17/2018  DOB: [birth year]  3D Planning Note   Prostate Brachytherapy Post-Implant Dosimetry  Diagnosis: 72 y.o. gentleman with Stage T1c adenocarcinoma of the prostate with Gleason Score of 4+3, and PSA of 4.48   Narrative: On a previous date, Manuel Bowers returned following prostate seed implantation for post implant planning. He underwent CT scan complex simulation to delineate the three-dimensional structures of the pelvis and demonstrate the radiation distribution.  Since that time, the seed localization, and complex isodose planning with dose volume histograms have now been completed.  Results:   Prostate Coverage - The dose of radiation delivered to the 90% or more of the prostate gland (D90) was 104.3% of the prescription dose. This exceeds our goal of greater than 90%. Rectal Sparing - The volume of rectal tissue receiving the prescription dose or higher was 0.0 cc. This falls under our thresholds tolerance of 1.0 cc.  Impression: The prostate seed implant appears to show adequate target coverage and appropriate rectal sparing.  Plan:  The patient will continue to follow with urology for ongoing PSA determinations. I would anticipate a high likelihood for local tumor control with minimal risk for rectal morbidity.  ________________________________  Sheral Apley Tammi Klippel, M.D.

## 2018-01-29 DIAGNOSIS — H401133 Primary open-angle glaucoma, bilateral, severe stage: Secondary | ICD-10-CM | POA: Diagnosis not present

## 2018-01-29 DIAGNOSIS — H401113 Primary open-angle glaucoma, right eye, severe stage: Secondary | ICD-10-CM | POA: Diagnosis not present

## 2018-01-29 DIAGNOSIS — H401123 Primary open-angle glaucoma, left eye, severe stage: Secondary | ICD-10-CM | POA: Diagnosis not present

## 2018-02-24 DIAGNOSIS — C61 Malignant neoplasm of prostate: Secondary | ICD-10-CM | POA: Diagnosis not present

## 2018-02-24 DIAGNOSIS — R310 Gross hematuria: Secondary | ICD-10-CM | POA: Diagnosis not present

## 2018-03-31 DIAGNOSIS — Z8546 Personal history of malignant neoplasm of prostate: Secondary | ICD-10-CM | POA: Diagnosis not present

## 2018-03-31 DIAGNOSIS — R3912 Poor urinary stream: Secondary | ICD-10-CM | POA: Diagnosis not present

## 2018-05-27 DIAGNOSIS — H401123 Primary open-angle glaucoma, left eye, severe stage: Secondary | ICD-10-CM | POA: Diagnosis not present

## 2018-05-27 DIAGNOSIS — Z961 Presence of intraocular lens: Secondary | ICD-10-CM | POA: Diagnosis not present

## 2018-05-27 DIAGNOSIS — H401113 Primary open-angle glaucoma, right eye, severe stage: Secondary | ICD-10-CM | POA: Diagnosis not present

## 2018-05-27 DIAGNOSIS — H401133 Primary open-angle glaucoma, bilateral, severe stage: Secondary | ICD-10-CM | POA: Diagnosis not present

## 2018-06-26 DIAGNOSIS — Z8546 Personal history of malignant neoplasm of prostate: Secondary | ICD-10-CM | POA: Diagnosis not present

## 2018-07-03 DIAGNOSIS — Z8546 Personal history of malignant neoplasm of prostate: Secondary | ICD-10-CM | POA: Diagnosis not present

## 2018-07-03 DIAGNOSIS — R3912 Poor urinary stream: Secondary | ICD-10-CM | POA: Diagnosis not present

## 2018-10-09 DIAGNOSIS — H401123 Primary open-angle glaucoma, left eye, severe stage: Secondary | ICD-10-CM | POA: Diagnosis not present

## 2018-10-09 DIAGNOSIS — H401112 Primary open-angle glaucoma, right eye, moderate stage: Secondary | ICD-10-CM | POA: Diagnosis not present

## 2018-11-13 DIAGNOSIS — H401123 Primary open-angle glaucoma, left eye, severe stage: Secondary | ICD-10-CM | POA: Diagnosis not present

## 2018-11-13 DIAGNOSIS — H209 Unspecified iridocyclitis: Secondary | ICD-10-CM | POA: Diagnosis not present

## 2018-11-13 DIAGNOSIS — H401133 Primary open-angle glaucoma, bilateral, severe stage: Secondary | ICD-10-CM | POA: Diagnosis not present

## 2018-11-13 DIAGNOSIS — H401113 Primary open-angle glaucoma, right eye, severe stage: Secondary | ICD-10-CM | POA: Diagnosis not present

## 2018-12-18 DIAGNOSIS — Z8546 Personal history of malignant neoplasm of prostate: Secondary | ICD-10-CM | POA: Diagnosis not present

## 2018-12-25 DIAGNOSIS — Z8546 Personal history of malignant neoplasm of prostate: Secondary | ICD-10-CM | POA: Diagnosis not present

## 2018-12-25 DIAGNOSIS — R3912 Poor urinary stream: Secondary | ICD-10-CM | POA: Diagnosis not present

## 2018-12-30 DIAGNOSIS — H401123 Primary open-angle glaucoma, left eye, severe stage: Secondary | ICD-10-CM | POA: Diagnosis not present

## 2018-12-30 DIAGNOSIS — H401133 Primary open-angle glaucoma, bilateral, severe stage: Secondary | ICD-10-CM | POA: Diagnosis not present

## 2018-12-30 DIAGNOSIS — H401113 Primary open-angle glaucoma, right eye, severe stage: Secondary | ICD-10-CM | POA: Diagnosis not present

## 2018-12-30 DIAGNOSIS — H209 Unspecified iridocyclitis: Secondary | ICD-10-CM | POA: Diagnosis not present

## 2019-01-04 DIAGNOSIS — H35361 Drusen (degenerative) of macula, right eye: Secondary | ICD-10-CM | POA: Diagnosis not present

## 2019-01-04 DIAGNOSIS — H30003 Unspecified focal chorioretinal inflammation, bilateral: Secondary | ICD-10-CM | POA: Diagnosis not present

## 2019-01-04 DIAGNOSIS — H21541 Posterior synechiae (iris), right eye: Secondary | ICD-10-CM | POA: Diagnosis not present

## 2019-01-04 DIAGNOSIS — H5450 Low vision, one eye, unspecified eye: Secondary | ICD-10-CM | POA: Diagnosis not present

## 2019-01-04 DIAGNOSIS — Z961 Presence of intraocular lens: Secondary | ICD-10-CM | POA: Diagnosis not present

## 2019-01-04 DIAGNOSIS — H5440 Blindness, one eye, unspecified eye: Secondary | ICD-10-CM | POA: Diagnosis not present

## 2019-01-04 DIAGNOSIS — H35373 Puckering of macula, bilateral: Secondary | ICD-10-CM | POA: Diagnosis not present

## 2019-01-04 DIAGNOSIS — H401133 Primary open-angle glaucoma, bilateral, severe stage: Secondary | ICD-10-CM | POA: Diagnosis not present

## 2019-01-11 DIAGNOSIS — H30003 Unspecified focal chorioretinal inflammation, bilateral: Secondary | ICD-10-CM | POA: Diagnosis not present

## 2019-01-18 DIAGNOSIS — Z1589 Genetic susceptibility to other disease: Secondary | ICD-10-CM | POA: Diagnosis not present

## 2019-01-18 DIAGNOSIS — H401133 Primary open-angle glaucoma, bilateral, severe stage: Secondary | ICD-10-CM | POA: Diagnosis not present

## 2019-01-18 DIAGNOSIS — H35373 Puckering of macula, bilateral: Secondary | ICD-10-CM | POA: Diagnosis not present

## 2019-01-18 DIAGNOSIS — Z961 Presence of intraocular lens: Secondary | ICD-10-CM | POA: Diagnosis not present

## 2019-01-18 DIAGNOSIS — H30003 Unspecified focal chorioretinal inflammation, bilateral: Secondary | ICD-10-CM | POA: Diagnosis not present

## 2019-01-18 DIAGNOSIS — Z9889 Other specified postprocedural states: Secondary | ICD-10-CM | POA: Diagnosis not present

## 2019-01-18 DIAGNOSIS — H5440 Blindness, one eye, unspecified eye: Secondary | ICD-10-CM | POA: Diagnosis not present

## 2019-01-18 DIAGNOSIS — H5450 Low vision, one eye, unspecified eye: Secondary | ICD-10-CM | POA: Diagnosis not present

## 2019-01-18 DIAGNOSIS — Z982 Presence of cerebrospinal fluid drainage device: Secondary | ICD-10-CM | POA: Diagnosis not present

## 2019-01-22 DIAGNOSIS — H401113 Primary open-angle glaucoma, right eye, severe stage: Secondary | ICD-10-CM | POA: Diagnosis not present

## 2019-01-22 DIAGNOSIS — Z961 Presence of intraocular lens: Secondary | ICD-10-CM | POA: Diagnosis not present

## 2019-01-22 DIAGNOSIS — H401133 Primary open-angle glaucoma, bilateral, severe stage: Secondary | ICD-10-CM | POA: Diagnosis not present

## 2019-01-22 DIAGNOSIS — H34211 Partial retinal artery occlusion, right eye: Secondary | ICD-10-CM | POA: Diagnosis not present

## 2019-01-22 DIAGNOSIS — H401123 Primary open-angle glaucoma, left eye, severe stage: Secondary | ICD-10-CM | POA: Diagnosis not present

## 2019-01-28 DIAGNOSIS — Z0000 Encounter for general adult medical examination without abnormal findings: Secondary | ICD-10-CM | POA: Diagnosis not present

## 2019-01-28 DIAGNOSIS — E782 Mixed hyperlipidemia: Secondary | ICD-10-CM | POA: Diagnosis not present

## 2019-01-28 DIAGNOSIS — M179 Osteoarthritis of knee, unspecified: Secondary | ICD-10-CM | POA: Diagnosis not present

## 2019-01-28 DIAGNOSIS — M109 Gout, unspecified: Secondary | ICD-10-CM | POA: Diagnosis not present

## 2019-01-28 DIAGNOSIS — I10 Essential (primary) hypertension: Secondary | ICD-10-CM | POA: Diagnosis not present

## 2019-02-01 DIAGNOSIS — H401133 Primary open-angle glaucoma, bilateral, severe stage: Secondary | ICD-10-CM | POA: Diagnosis not present

## 2019-02-01 DIAGNOSIS — H209 Unspecified iridocyclitis: Secondary | ICD-10-CM | POA: Diagnosis not present

## 2019-02-01 DIAGNOSIS — H34211 Partial retinal artery occlusion, right eye: Secondary | ICD-10-CM | POA: Diagnosis not present

## 2019-02-11 DIAGNOSIS — Z961 Presence of intraocular lens: Secondary | ICD-10-CM | POA: Diagnosis not present

## 2019-02-11 DIAGNOSIS — Z87891 Personal history of nicotine dependence: Secondary | ICD-10-CM | POA: Diagnosis not present

## 2019-02-11 DIAGNOSIS — H209 Unspecified iridocyclitis: Secondary | ICD-10-CM | POA: Diagnosis not present

## 2019-02-11 DIAGNOSIS — I10 Essential (primary) hypertension: Secondary | ICD-10-CM | POA: Diagnosis not present

## 2019-02-11 DIAGNOSIS — E785 Hyperlipidemia, unspecified: Secondary | ICD-10-CM | POA: Diagnosis not present

## 2019-02-11 DIAGNOSIS — H401133 Primary open-angle glaucoma, bilateral, severe stage: Secondary | ICD-10-CM | POA: Diagnosis not present

## 2019-02-11 DIAGNOSIS — Z9842 Cataract extraction status, left eye: Secondary | ICD-10-CM | POA: Diagnosis not present

## 2019-02-11 DIAGNOSIS — Z9841 Cataract extraction status, right eye: Secondary | ICD-10-CM | POA: Diagnosis not present

## 2019-02-11 DIAGNOSIS — H34211 Partial retinal artery occlusion, right eye: Secondary | ICD-10-CM | POA: Diagnosis not present

## 2019-02-11 DIAGNOSIS — H401113 Primary open-angle glaucoma, right eye, severe stage: Secondary | ICD-10-CM | POA: Diagnosis not present

## 2019-04-05 DIAGNOSIS — H40033 Anatomical narrow angle, bilateral: Secondary | ICD-10-CM | POA: Diagnosis not present

## 2019-04-05 DIAGNOSIS — H1013 Acute atopic conjunctivitis, bilateral: Secondary | ICD-10-CM | POA: Diagnosis not present

## 2019-05-14 DIAGNOSIS — H401133 Primary open-angle glaucoma, bilateral, severe stage: Secondary | ICD-10-CM | POA: Diagnosis not present

## 2019-05-14 DIAGNOSIS — H401113 Primary open-angle glaucoma, right eye, severe stage: Secondary | ICD-10-CM | POA: Diagnosis not present

## 2019-05-14 DIAGNOSIS — H401123 Primary open-angle glaucoma, left eye, severe stage: Secondary | ICD-10-CM | POA: Diagnosis not present

## 2019-05-14 DIAGNOSIS — H209 Unspecified iridocyclitis: Secondary | ICD-10-CM | POA: Diagnosis not present

## 2019-06-07 DIAGNOSIS — Z8546 Personal history of malignant neoplasm of prostate: Secondary | ICD-10-CM | POA: Diagnosis not present

## 2019-06-14 DIAGNOSIS — Z8546 Personal history of malignant neoplasm of prostate: Secondary | ICD-10-CM | POA: Diagnosis not present

## 2019-06-14 DIAGNOSIS — R3912 Poor urinary stream: Secondary | ICD-10-CM | POA: Diagnosis not present

## 2019-06-21 DIAGNOSIS — R69 Illness, unspecified: Secondary | ICD-10-CM | POA: Diagnosis not present

## 2019-06-30 DIAGNOSIS — H209 Unspecified iridocyclitis: Secondary | ICD-10-CM | POA: Diagnosis not present

## 2019-06-30 DIAGNOSIS — H401133 Primary open-angle glaucoma, bilateral, severe stage: Secondary | ICD-10-CM | POA: Diagnosis not present

## 2019-08-30 DIAGNOSIS — H401113 Primary open-angle glaucoma, right eye, severe stage: Secondary | ICD-10-CM | POA: Diagnosis not present

## 2019-08-30 DIAGNOSIS — H401133 Primary open-angle glaucoma, bilateral, severe stage: Secondary | ICD-10-CM | POA: Diagnosis not present

## 2019-08-30 DIAGNOSIS — H401123 Primary open-angle glaucoma, left eye, severe stage: Secondary | ICD-10-CM | POA: Diagnosis not present

## 2019-08-30 DIAGNOSIS — H209 Unspecified iridocyclitis: Secondary | ICD-10-CM | POA: Diagnosis not present

## 2020-01-04 DIAGNOSIS — R3912 Poor urinary stream: Secondary | ICD-10-CM | POA: Diagnosis not present

## 2020-01-04 DIAGNOSIS — Z8546 Personal history of malignant neoplasm of prostate: Secondary | ICD-10-CM | POA: Diagnosis not present

## 2020-01-04 DIAGNOSIS — C61 Malignant neoplasm of prostate: Secondary | ICD-10-CM | POA: Diagnosis not present

## 2020-01-05 DIAGNOSIS — H401133 Primary open-angle glaucoma, bilateral, severe stage: Secondary | ICD-10-CM | POA: Diagnosis not present

## 2020-01-05 DIAGNOSIS — H401113 Primary open-angle glaucoma, right eye, severe stage: Secondary | ICD-10-CM | POA: Diagnosis not present

## 2020-01-05 DIAGNOSIS — H401123 Primary open-angle glaucoma, left eye, severe stage: Secondary | ICD-10-CM | POA: Diagnosis not present

## 2020-02-29 DIAGNOSIS — E782 Mixed hyperlipidemia: Secondary | ICD-10-CM | POA: Diagnosis not present

## 2020-02-29 DIAGNOSIS — I10 Essential (primary) hypertension: Secondary | ICD-10-CM | POA: Diagnosis not present

## 2020-02-29 DIAGNOSIS — M109 Gout, unspecified: Secondary | ICD-10-CM | POA: Diagnosis not present

## 2020-03-06 DIAGNOSIS — E782 Mixed hyperlipidemia: Secondary | ICD-10-CM | POA: Diagnosis not present

## 2020-03-06 DIAGNOSIS — Z0000 Encounter for general adult medical examination without abnormal findings: Secondary | ICD-10-CM | POA: Diagnosis not present

## 2020-03-06 DIAGNOSIS — M179 Osteoarthritis of knee, unspecified: Secondary | ICD-10-CM | POA: Diagnosis not present

## 2020-03-06 DIAGNOSIS — I10 Essential (primary) hypertension: Secondary | ICD-10-CM | POA: Diagnosis not present

## 2020-03-06 DIAGNOSIS — M109 Gout, unspecified: Secondary | ICD-10-CM | POA: Diagnosis not present

## 2020-05-24 DIAGNOSIS — H401133 Primary open-angle glaucoma, bilateral, severe stage: Secondary | ICD-10-CM | POA: Diagnosis not present

## 2020-05-24 DIAGNOSIS — H401113 Primary open-angle glaucoma, right eye, severe stage: Secondary | ICD-10-CM | POA: Diagnosis not present

## 2020-05-24 DIAGNOSIS — H401123 Primary open-angle glaucoma, left eye, severe stage: Secondary | ICD-10-CM | POA: Diagnosis not present

## 2020-06-27 DIAGNOSIS — H5450 Low vision, one eye, unspecified eye: Secondary | ICD-10-CM | POA: Diagnosis not present

## 2020-06-27 DIAGNOSIS — H30003 Unspecified focal chorioretinal inflammation, bilateral: Secondary | ICD-10-CM | POA: Diagnosis not present

## 2020-06-27 DIAGNOSIS — H35373 Puckering of macula, bilateral: Secondary | ICD-10-CM | POA: Diagnosis not present

## 2020-06-27 DIAGNOSIS — H401133 Primary open-angle glaucoma, bilateral, severe stage: Secondary | ICD-10-CM | POA: Diagnosis not present

## 2020-06-27 DIAGNOSIS — H5440 Blindness, one eye, unspecified eye: Secondary | ICD-10-CM | POA: Diagnosis not present

## 2020-06-27 DIAGNOSIS — Z961 Presence of intraocular lens: Secondary | ICD-10-CM | POA: Diagnosis not present

## 2020-07-17 DIAGNOSIS — R9721 Rising PSA following treatment for malignant neoplasm of prostate: Secondary | ICD-10-CM | POA: Diagnosis not present

## 2020-07-17 DIAGNOSIS — C61 Malignant neoplasm of prostate: Secondary | ICD-10-CM | POA: Diagnosis not present

## 2020-08-15 DIAGNOSIS — H401133 Primary open-angle glaucoma, bilateral, severe stage: Secondary | ICD-10-CM | POA: Diagnosis not present

## 2020-08-15 DIAGNOSIS — H35373 Puckering of macula, bilateral: Secondary | ICD-10-CM | POA: Diagnosis not present

## 2020-08-15 DIAGNOSIS — Z961 Presence of intraocular lens: Secondary | ICD-10-CM | POA: Diagnosis not present

## 2020-08-15 DIAGNOSIS — H5440 Blindness, one eye, unspecified eye: Secondary | ICD-10-CM | POA: Diagnosis not present

## 2020-08-15 DIAGNOSIS — H30003 Unspecified focal chorioretinal inflammation, bilateral: Secondary | ICD-10-CM | POA: Diagnosis not present

## 2020-08-15 DIAGNOSIS — H5450 Low vision, one eye, unspecified eye: Secondary | ICD-10-CM | POA: Diagnosis not present

## 2020-10-01 DIAGNOSIS — E782 Mixed hyperlipidemia: Secondary | ICD-10-CM | POA: Diagnosis not present

## 2020-10-01 DIAGNOSIS — I10 Essential (primary) hypertension: Secondary | ICD-10-CM | POA: Diagnosis not present

## 2020-10-01 DIAGNOSIS — H409 Unspecified glaucoma: Secondary | ICD-10-CM | POA: Diagnosis not present

## 2020-10-01 DIAGNOSIS — C61 Malignant neoplasm of prostate: Secondary | ICD-10-CM | POA: Diagnosis not present

## 2020-10-01 DIAGNOSIS — M150 Primary generalized (osteo)arthritis: Secondary | ICD-10-CM | POA: Diagnosis not present

## 2020-10-01 DIAGNOSIS — M179 Osteoarthritis of knee, unspecified: Secondary | ICD-10-CM | POA: Diagnosis not present

## 2020-10-02 DIAGNOSIS — C61 Malignant neoplasm of prostate: Secondary | ICD-10-CM | POA: Diagnosis not present

## 2020-10-09 DIAGNOSIS — Z8546 Personal history of malignant neoplasm of prostate: Secondary | ICD-10-CM | POA: Diagnosis not present

## 2020-10-09 DIAGNOSIS — R9721 Rising PSA following treatment for malignant neoplasm of prostate: Secondary | ICD-10-CM | POA: Diagnosis not present

## 2020-10-10 DIAGNOSIS — H5450 Low vision, one eye, unspecified eye: Secondary | ICD-10-CM | POA: Diagnosis not present

## 2020-10-10 DIAGNOSIS — Z961 Presence of intraocular lens: Secondary | ICD-10-CM | POA: Diagnosis not present

## 2020-10-10 DIAGNOSIS — H30003 Unspecified focal chorioretinal inflammation, bilateral: Secondary | ICD-10-CM | POA: Diagnosis not present

## 2020-10-10 DIAGNOSIS — H5440 Blindness, one eye, unspecified eye: Secondary | ICD-10-CM | POA: Diagnosis not present

## 2020-10-10 DIAGNOSIS — H35373 Puckering of macula, bilateral: Secondary | ICD-10-CM | POA: Diagnosis not present

## 2020-10-10 DIAGNOSIS — H401133 Primary open-angle glaucoma, bilateral, severe stage: Secondary | ICD-10-CM | POA: Diagnosis not present

## 2020-10-17 ENCOUNTER — Other Ambulatory Visit: Payer: Self-pay | Admitting: Urology

## 2020-10-17 ENCOUNTER — Other Ambulatory Visit (HOSPITAL_COMMUNITY): Payer: Self-pay | Admitting: Urology

## 2020-10-17 DIAGNOSIS — R9721 Rising PSA following treatment for malignant neoplasm of prostate: Secondary | ICD-10-CM

## 2020-10-18 DIAGNOSIS — H30003 Unspecified focal chorioretinal inflammation, bilateral: Secondary | ICD-10-CM | POA: Diagnosis not present

## 2020-10-24 ENCOUNTER — Other Ambulatory Visit: Payer: Self-pay

## 2020-10-24 ENCOUNTER — Encounter (HOSPITAL_COMMUNITY)
Admission: RE | Admit: 2020-10-24 | Discharge: 2020-10-24 | Disposition: A | Discharge status: Home / Self Care | Payer: Medicare HMO | Source: Ambulatory Visit | Attending: Urology | Admitting: Urology

## 2020-10-24 DIAGNOSIS — R9721 Rising PSA following treatment for malignant neoplasm of prostate: Secondary | ICD-10-CM | POA: Diagnosis not present

## 2020-10-24 DIAGNOSIS — M170 Bilateral primary osteoarthritis of knee: Secondary | ICD-10-CM | POA: Diagnosis not present

## 2020-10-24 DIAGNOSIS — C61 Malignant neoplasm of prostate: Secondary | ICD-10-CM | POA: Diagnosis not present

## 2020-10-24 DIAGNOSIS — M19011 Primary osteoarthritis, right shoulder: Secondary | ICD-10-CM | POA: Diagnosis not present

## 2020-10-24 DIAGNOSIS — Z8546 Personal history of malignant neoplasm of prostate: Secondary | ICD-10-CM | POA: Diagnosis not present

## 2020-10-24 DIAGNOSIS — M19072 Primary osteoarthritis, left ankle and foot: Secondary | ICD-10-CM | POA: Diagnosis not present

## 2020-10-24 MED ORDER — TECHNETIUM TC 99M MEDRONATE IV KIT
20.8000 | PACK | Freq: Once | INTRAVENOUS | Status: AC | PRN
  Administered 2020-10-24: 12:00:00 20.8 via INTRAVENOUS

## 2020-11-27 DIAGNOSIS — H401113 Primary open-angle glaucoma, right eye, severe stage: Secondary | ICD-10-CM | POA: Diagnosis not present

## 2020-11-27 DIAGNOSIS — H401123 Primary open-angle glaucoma, left eye, severe stage: Secondary | ICD-10-CM | POA: Diagnosis not present

## 2020-11-27 DIAGNOSIS — H401133 Primary open-angle glaucoma, bilateral, severe stage: Secondary | ICD-10-CM | POA: Diagnosis not present

## 2020-11-27 DIAGNOSIS — H209 Unspecified iridocyclitis: Secondary | ICD-10-CM | POA: Diagnosis not present

## 2020-11-27 DIAGNOSIS — Z961 Presence of intraocular lens: Secondary | ICD-10-CM | POA: Diagnosis not present

## 2020-11-28 DIAGNOSIS — E782 Mixed hyperlipidemia: Secondary | ICD-10-CM | POA: Diagnosis not present

## 2020-11-28 DIAGNOSIS — M179 Osteoarthritis of knee, unspecified: Secondary | ICD-10-CM | POA: Diagnosis not present

## 2020-11-28 DIAGNOSIS — H409 Unspecified glaucoma: Secondary | ICD-10-CM | POA: Diagnosis not present

## 2020-11-28 DIAGNOSIS — M150 Primary generalized (osteo)arthritis: Secondary | ICD-10-CM | POA: Diagnosis not present

## 2020-11-28 DIAGNOSIS — I10 Essential (primary) hypertension: Secondary | ICD-10-CM | POA: Diagnosis not present

## 2020-11-28 DIAGNOSIS — C61 Malignant neoplasm of prostate: Secondary | ICD-10-CM | POA: Diagnosis not present

## 2020-12-04 DIAGNOSIS — C61 Malignant neoplasm of prostate: Secondary | ICD-10-CM | POA: Diagnosis not present

## 2020-12-04 DIAGNOSIS — R972 Elevated prostate specific antigen [PSA]: Secondary | ICD-10-CM | POA: Diagnosis not present

## 2020-12-04 DIAGNOSIS — N4289 Other specified disorders of prostate: Secondary | ICD-10-CM | POA: Diagnosis not present

## 2020-12-18 DIAGNOSIS — H401133 Primary open-angle glaucoma, bilateral, severe stage: Secondary | ICD-10-CM | POA: Diagnosis not present

## 2020-12-18 DIAGNOSIS — Z961 Presence of intraocular lens: Secondary | ICD-10-CM | POA: Diagnosis not present

## 2020-12-18 DIAGNOSIS — H401113 Primary open-angle glaucoma, right eye, severe stage: Secondary | ICD-10-CM | POA: Diagnosis not present

## 2020-12-18 DIAGNOSIS — H401123 Primary open-angle glaucoma, left eye, severe stage: Secondary | ICD-10-CM | POA: Diagnosis not present

## 2020-12-19 DIAGNOSIS — H30003 Unspecified focal chorioretinal inflammation, bilateral: Secondary | ICD-10-CM | POA: Diagnosis not present

## 2020-12-19 DIAGNOSIS — H401133 Primary open-angle glaucoma, bilateral, severe stage: Secondary | ICD-10-CM | POA: Diagnosis not present

## 2020-12-19 DIAGNOSIS — Z961 Presence of intraocular lens: Secondary | ICD-10-CM | POA: Diagnosis not present

## 2020-12-19 DIAGNOSIS — H5450 Low vision, one eye, unspecified eye: Secondary | ICD-10-CM | POA: Diagnosis not present

## 2020-12-19 DIAGNOSIS — H35373 Puckering of macula, bilateral: Secondary | ICD-10-CM | POA: Diagnosis not present

## 2020-12-19 DIAGNOSIS — Z79899 Other long term (current) drug therapy: Secondary | ICD-10-CM | POA: Diagnosis not present

## 2020-12-19 DIAGNOSIS — H5440 Blindness, one eye, unspecified eye: Secondary | ICD-10-CM | POA: Diagnosis not present

## 2020-12-21 DIAGNOSIS — M150 Primary generalized (osteo)arthritis: Secondary | ICD-10-CM | POA: Diagnosis not present

## 2020-12-21 DIAGNOSIS — C61 Malignant neoplasm of prostate: Secondary | ICD-10-CM | POA: Diagnosis not present

## 2020-12-21 DIAGNOSIS — H409 Unspecified glaucoma: Secondary | ICD-10-CM | POA: Diagnosis not present

## 2020-12-21 DIAGNOSIS — M179 Osteoarthritis of knee, unspecified: Secondary | ICD-10-CM | POA: Diagnosis not present

## 2020-12-21 DIAGNOSIS — I10 Essential (primary) hypertension: Secondary | ICD-10-CM | POA: Diagnosis not present

## 2020-12-21 DIAGNOSIS — E782 Mixed hyperlipidemia: Secondary | ICD-10-CM | POA: Diagnosis not present

## 2020-12-25 ENCOUNTER — Other Ambulatory Visit: Payer: Self-pay

## 2020-12-25 ENCOUNTER — Encounter: Payer: Self-pay | Admitting: Urology

## 2020-12-25 ENCOUNTER — Telehealth: Payer: Self-pay

## 2020-12-25 NOTE — Telephone Encounter (Signed)
Spoke with patient in regards to telephone appointment with Freeman Caldron PA on 01/05/21 @ 9:00am. Patient verbalized understanding of appointment date and time. Reviewed meaningful use and AUA questions. TM

## 2020-12-27 ENCOUNTER — Telehealth: Payer: Self-pay | Admitting: *Deleted

## 2020-12-27 NOTE — Telephone Encounter (Signed)
CALLED PATIENT TO INFORM THAT DR. MANNING'S PA WILL BE CALLING ON 01-05-21, INSTEAD OF DR. MANNING, DUE TO DR. MANNING BEING OUT OF TOWN, LVM FOR A RETURN CALL

## 2020-12-27 NOTE — Telephone Encounter (Signed)
CALLED PATIENT TO INFORM THAT ASHLYN BRUNING WOULD BE CALLING HIM ON 01-05-21 INSTEAD OF DR. MANNING DUE TO DR. MANNING BEING OUT OF THE OFFICE, PATIENT STATED THAT WOULD BE JUST FINE AND THAT HE HAS NO PROBLEMS WITH THIS

## 2021-01-05 ENCOUNTER — Ambulatory Visit
Admission: RE | Admit: 2021-01-05 | Discharge: 2021-01-05 | Disposition: A | Discharge status: Home / Self Care | Payer: Medicare HMO | Source: Ambulatory Visit | Attending: Urology | Admitting: Urology

## 2021-01-05 ENCOUNTER — Other Ambulatory Visit: Payer: Self-pay

## 2021-01-05 ENCOUNTER — Telehealth: Payer: Self-pay | Admitting: Radiation Oncology

## 2021-01-05 ENCOUNTER — Encounter: Payer: Self-pay | Admitting: Urology

## 2021-01-05 VITALS — BP 144/88 | HR 71 | Temp 97.8°F | Resp 17 | Ht 69.0 in | Wt 190.1 lb

## 2021-01-05 DIAGNOSIS — C61 Malignant neoplasm of prostate: Secondary | ICD-10-CM | POA: Insufficient documentation

## 2021-01-05 DIAGNOSIS — Z87891 Personal history of nicotine dependence: Secondary | ICD-10-CM | POA: Insufficient documentation

## 2021-01-05 DIAGNOSIS — Z79899 Other long term (current) drug therapy: Secondary | ICD-10-CM | POA: Insufficient documentation

## 2021-01-05 DIAGNOSIS — M129 Arthropathy, unspecified: Secondary | ICD-10-CM | POA: Insufficient documentation

## 2021-01-05 DIAGNOSIS — Z923 Personal history of irradiation: Secondary | ICD-10-CM | POA: Insufficient documentation

## 2021-01-05 DIAGNOSIS — Z7982 Long term (current) use of aspirin: Secondary | ICD-10-CM | POA: Diagnosis not present

## 2021-01-05 DIAGNOSIS — N400 Enlarged prostate without lower urinary tract symptoms: Secondary | ICD-10-CM | POA: Diagnosis not present

## 2021-01-05 DIAGNOSIS — I10 Essential (primary) hypertension: Secondary | ICD-10-CM | POA: Diagnosis not present

## 2021-01-05 DIAGNOSIS — R972 Elevated prostate specific antigen [PSA]: Secondary | ICD-10-CM | POA: Diagnosis not present

## 2021-01-05 NOTE — Telephone Encounter (Signed)
Received voicemail message from patient confused about consult appointment for Friday; if its a telephone encounter or in person. Phoned patient back promptly. No answer. Left detailed message explaining his appointment is via phone. Provided my directed number for further questions.

## 2021-01-05 NOTE — Progress Notes (Signed)
Radiation Oncology         [phone] ________________________________  Outpatient Re-Consultation  Name: Manuel Bowers MRN: [id number]  Date: 01/05/2021  DOB: [birth year]  ON:GEXB, Dwyane Luo, MD  Davis Gourd*   REFERRING PHYSICIAN: Davis Gourd*  DIAGNOSIS: 75 y.o. gentleman with rising PSA, currently at 4.51 s/p brachytherapy in 12/2017 for Stage T1c Gleason 4+3 prostate cancer.    ICD-10-CM   1. Malignant neoplasm of prostate ([place])  C61     HISTORY OF PRESENT ILLNESS: Manuel Bowers is a 75 y.o. male with a diagnosis of prostate cancer. He was noted to have an elevated PSA of 4.69 by his primary care physician, Dr. Melinda Crutch during a yearly physical in Feb 2018. Accordingly, he was referred for evaluation in urology by Dr. Alyson Ingles on 05/30/2017, digital rectal examination was performed at that time revealing symmetric prostate lobes without discrete nodularity. A PSA was repeated on 05/30/2017 and remained elevated at 4.48. The patient proceeded to transrectal ultrasound with 12 biopsies of the prostate on 08/07/2017.  The prostate volume measured 44.96 cc. Out of 12 core biopsies, 6 were positive, all on the left. The maximum Gleason score was 4+3, and this was seen in the left mid lateral.  Gleason 3+4 was seen in the left base, left base lateral, left mid, left apex and left apex lateral.  The patient had a CT A/P and Bone scan for disease staging completed on 09/10/2017, both of which were negative for evidence of metastatic disease.   We met with the patient on 09/11/2017, and he opted to proceed with brachytherapy. The procedure was performed on 12/15/2017 with Dr. Alyson Ingles. Following the procedure, the patient's PSA reached a nadir of 0.86 in 06/2018. From that point, the PSA began to steadily rise again, reaching 4.51 in 09/2020. With his care being transferred to Dr. Lovena Neighbours, he underwent restaging CT and bone scan on 10/24/2020. Both were negative for recurrent or  metastatic disease.  He underwent repeat biopsy of the prostate on 12/04/2020. The prostate measured 27.29 cc. Out of 12 core biopsies, only 1 was positive. The right base showed a small focus (5%) of prostatic adenocarcinoma without showing significant treatment effect, Gleason 3+4.  The patient reviewed the biopsy results with his urologist and he has kindly been referred today for discussion of potential radiation treatment options.   PREVIOUS RADIATION THERAPY: Yes 12/15/2017: Prostate, brachytherapy / 145 Gy  PAST MEDICAL HISTORY:  Past Medical History:  Diagnosis Date  . Anisocoria   . Arthritis   . ED (erectile dysfunction)   . Elevated lipids   . Fluid in knee   . Glaucoma   . History of inguinal hernia    left  . Hypertension   . Prostate cancer ([place])   . Wears glasses       PAST SURGICAL HISTORY: Past Surgical History:  Procedure Laterality Date  . COLONOSCOPY    . CYSTOSCOPY  12/15/2017   Procedure: CYSTOSCOPY;  Surgeon: Cleon Gustin, MD;  Location: [facility];  Service: Urology;;  no seeds noted in bladder  . EYE SURGERY  2004   lt eye for glaucoma  . INGUINAL HERNIA REPAIR Left 05/24/2014   Procedure: LEFT INGUINAL HERNIA REPAIR ;  Surgeon: [name]. Georgette Dover, MD;  Location: [facility];  Service: General;  Laterality: Left;  . PROSTATE BIOPSY    . RADIOACTIVE SEED IMPLANT N/A 12/15/2017   Procedure: RADIOACTIVE SEED IMPLANT/BRACHYTHERAPY IMPLANT;  Surgeon: Cleon Gustin,  MD;  Location: [facility];  Service: Urology;  Laterality: N/A;   82 seeds implanted  . SPACE OAR INSTILLATION N/A 12/15/2017   Procedure: SPACE OAR INSTILLATION;  Surgeon: Cleon Gustin, MD;  Location: [facility];  Service: Urology;  Laterality: N/A;    FAMILY HISTORY:  Family History  Problem Relation Age of Onset  . Cancer Neg Hx   . Breast cancer Neg Hx   . Colon cancer Neg Hx   . Prostate cancer Neg Hx   .  Pancreatic cancer Neg Hx     SOCIAL HISTORY:  Social History   Socioeconomic History  . Marital status: Married    Spouse name: Not on file  . Number of children: 5  . Years of education: Not on file  . Highest education level: Not on file  Occupational History    Comment: full time  Tobacco Use  . Smoking status: Former Smoker    Packs/day: 0.25    Years: 2.00    Pack years: 0.50    Types: Cigarettes    Quit date: 11/05/1963    Years since quitting: 57.2  . Smokeless tobacco: Never Used  Vaping Use  . Vaping Use: Never used  Substance and Sexual Activity  . Alcohol use: No  . Drug use: No  . Sexual activity: Yes  Other Topics Concern  . Not on file  Social History Narrative   75 year old male. Married. Former smoker.    Social Determinants of Health   Financial Resource Strain: Not on file  Food Insecurity: Not on file  Transportation Needs: Not on file  Physical Activity: Not on file  Stress: Not on file  Social Connections: Not on file  Intimate Partner Violence: Not on file    ALLERGIES: Patient has no known allergies.  MEDICATIONS:  Current Outpatient Medications  Medication Sig Dispense Refill  . Artificial Tear Solution (SOOTHE XP OP) Apply to eye.    Marland Kitchen aspirin 81 MG tablet Take 81 mg by mouth daily.    Marland Kitchen atorvastatin (LIPITOR) 40 MG tablet     . atropine 1 % ophthalmic solution Place one drop into the right eye 2 (two) times daily for 14 days. Do not take tonight.  Dr. Ander Slade will evaluate your post-operative status tomorrow and may make changes to the duration and dose of this medication.    . brimonidine (ALPHAGAN) 0.2 % ophthalmic solution Place 1 drop into both eyes 3 (three) times daily.     . brinzolamide (AZOPT) 1 % ophthalmic suspension Place one drop into the left eye 2 (two) times daily.    . dorzolamide-timolol (COSOPT) 22.3-6.8 MG/ML ophthalmic solution     . fluorometholone (FML) 0.1 % ophthalmic suspension Place 1 drop into the right eye 2  times daily.    . hydrochlorothiazide (HYDRODIURIL) 25 MG tablet Take 25 mg by mouth daily.    Marland Kitchen ibuprofen (ADVIL,MOTRIN) 200 MG tablet Take 200 mg by mouth every 8 (eight) hours as needed for moderate pain.    Marland Kitchen ketorolac (ACULAR) 0.5 % ophthalmic solution     . latanoprost (XALATAN) 0.005 % ophthalmic solution Place 1 drop into both eyes at bedtime.    Marland Kitchen LUMIGAN 0.01 % SOLN     . moxifloxacin (VIGAMOX) 0.5 % ophthalmic solution Insert one drop 3 times a day into surgical eye starting 4 days prior to surgery    . neomycin-polymyxin-dexameth (MAXITROL) 0.1 % OINT Do not use tonight!   Apply  a strip of ointment the size of a grain of rice to RIGHT eye at bedtime. This must be the last eye medicine of the day.   Dr. Ander Slade will evaluate your post-operative status tomorrow and may make changes to the duration and dose of this medication.    . polyvinyl alcohol (LIQUIFILM TEARS) 1.4 % ophthalmic solution 1 drop as needed.    Marland Kitchen ROCKLATAN 0.02-0.005 % SOLN     . simvastatin (ZOCOR) 40 MG tablet Take 40 mg by mouth daily.    Marland Kitchen telmisartan (MICARDIS) 40 MG tablet     . timolol (TIMOPTIC) 0.5 % ophthalmic solution Place one drop into the left eye 2 (two) times daily.    Marland Kitchen HYDROcodone-acetaminophen (NORCO/VICODIN) 5-325 MG tablet Take 1 tablet by mouth every 4 (four) hours as needed for severe pain. (Patient not taking: No sig reported) 15 tablet 0   No current facility-administered medications for this encounter.    REVIEW OF SYSTEMS:  On review of systems, the patient reports that he is doing well overall. He denies any chest pain, shortness of breath, cough, fevers, chills, night sweats, unintended weight changes. He denies any bowel disturbances, and denies abdominal pain, nausea or vomiting. He denies any new musculoskeletal or joint aches or pains. His IPSS was 9 indicating mild urinary symptoms. His main complaints are nocturia x4, weak urine stream, and some frequency. A complete review of systems  is obtained and is otherwise negative.   PHYSICAL EXAM:  Wt Readings from Last 3 Encounters:  01/05/21 190 lb 2 oz (86.2 kg)  12/26/17 189 lb (85.7 kg)  12/15/17 185 lb 11.2 oz (84.2 kg)   Temp Readings from Last 3 Encounters:  01/05/21 97.8 F (36.6 C) (Oral)  12/15/17 (!) 97.4 F (36.3 C)  01/14/16 97.6 F (36.4 C) (Oral)   BP Readings from Last 3 Encounters:  01/05/21 (!) 144/88  12/26/17 (!) 141/93  12/15/17 (!) 152/88   Pulse Readings from Last 3 Encounters:  01/05/21 71  12/26/17 77  12/15/17 63   Pain Assessment Pain Score: 0-No pain/10  In general this is a well appearing African American man in no acute distress. He's alert and oriented x4 and appropriate throughout the examination. Cardiopulmonary assessment is negative for acute distress and he exhibits normal effort.    KPS = 100  100 - Normal; no complaints; no evidence of disease. 90   - Able to carry on normal activity; minor signs or symptoms of disease. 80   - Normal activity with effort; some signs or symptoms of disease. 37   - Cares for self; unable to carry on normal activity or to do active work. 60   - Requires occasional assistance, but is able to care for most of his personal needs. 50   - Requires considerable assistance and frequent medical care. 30   - Disabled; requires special care and assistance. 13   - Severely disabled; hospital admission is indicated although death not imminent. 37   - Very sick; hospital admission necessary; active supportive treatment necessary. 10   - Moribund; fatal processes progressing rapidly. 0     - Dead  Karnofsky DA, Abelmann [place], Craver LS and Burchenal [facility] [phone]) The use of the nitrogen mustards in the palliative treatment of carcinoma: with particular reference to bronchogenic carcinoma Cancer 1 634-56  LABORATORY DATA:  Lab Results  Component Value Date   WBC 3.7 (L) 12/08/2017   HGB 13.6 12/08/2017   HCT 40.6 12/08/2017  MCV 85.1 12/08/2017   PLT  198 12/08/2017   Lab Results  Component Value Date   NA 140 12/08/2017   K 4.6 12/08/2017   CL 106 12/08/2017   CO2 28 12/08/2017   Lab Results  Component Value Date   ALT 16 (L) 12/08/2017   AST 19 12/08/2017   ALKPHOS 63 12/08/2017   BILITOT 0.8 12/08/2017     RADIOGRAPHY: No results found.    IMPRESSION/PLAN: 1. 75 y.o. gentleman with rising PSA, currently at 4.51 s/p brachytherapy in 12/2017 for Stage T1c Gleason 4+3 prostate cancer.  Today, we talked to the patient about the findings and workup thus far. We discussed the need to obtain further imaging with a PSMA scan to confirm any additional sites of disease prior to making a formal treatment recommendation. We discussed the natural history of prostate cancer and general treatment, highlighting the role of salvage radiotherapy in the management of recurrences. In his case, we discussed the use of stereotactic body radiotherapy (SBRT) to deliver additional radiation to the site(s) of recurrence versus repeat brachytherapy. We reviewed the details and logistics of delivery as well as the anticipated acute and late sequelae associated with radiation in this setting. The patient was encouraged to ask questions that were answered to his stated satisfaction.  At the end of our conversation, the patient would like to proceed with PSMA scan as recommended and based on those results, we will make final treatment recommendations. We will have this scan scheduled for the near future, and then plan to meet back with him virtually to discuss the results and recommendations. Once we have confirmation of location and quantity of recurrent disease, we can move forward with focused salvage SBRT targeting the site(s) of recurrence. We will share our discussion with Dr. Lovena Neighbours and move forward with PSMA scan in the near future.     Nicholos Johns, PA-C  This document serves as a record of services personally performed by Tyler Pita, MD and  Freeman Caldron, PA-C. It was created on their behalf by Wilburn Mylar, a trained medical scribe. The creation of this record is based on the scribe's personal observations and the provider's statements to them. This document has been checked and approved by the attending provider.

## 2021-01-16 ENCOUNTER — Telehealth: Payer: Self-pay | Admitting: *Deleted

## 2021-01-16 NOTE — Telephone Encounter (Signed)
CALLED PATIENT TO INFORM OF CT FOR 01-30-21- ARRIVAL TIME- 12:30 PM @ [facility], NO RESTRICTIONS TO TEST, LVM FOR A RETURN CALL

## 2021-01-17 DIAGNOSIS — I10 Essential (primary) hypertension: Secondary | ICD-10-CM | POA: Diagnosis not present

## 2021-01-17 DIAGNOSIS — Z87891 Personal history of nicotine dependence: Secondary | ICD-10-CM | POA: Diagnosis not present

## 2021-01-17 DIAGNOSIS — H401113 Primary open-angle glaucoma, right eye, severe stage: Secondary | ICD-10-CM | POA: Diagnosis not present

## 2021-01-22 ENCOUNTER — Telehealth: Payer: Self-pay

## 2021-01-22 NOTE — Telephone Encounter (Signed)
Left patient voicemail message in regards to telephone visit with Freeman Caldron PA on 02/02/21 @ 10:00am. Called to review meaningful use questions. TM

## 2021-01-24 ENCOUNTER — Encounter: Payer: Self-pay | Admitting: Urology

## 2021-01-24 ENCOUNTER — Telehealth: Payer: Self-pay

## 2021-01-24 NOTE — Telephone Encounter (Signed)
Spoke with patient in regards to telephone visit with Freeman Caldron PA on 02/02/21 @ 10:00am. Patient verbalized understanding of appointment date and time. Reviewed meaningful use questions. TM

## 2021-01-30 ENCOUNTER — Ambulatory Visit (HOSPITAL_COMMUNITY)
Admission: RE | Admit: 2021-01-30 | Discharge: 2021-01-30 | Disposition: A | Discharge status: Home / Self Care | Payer: Medicare HMO | Source: Ambulatory Visit | Attending: Urology | Admitting: Urology

## 2021-01-30 ENCOUNTER — Other Ambulatory Visit: Payer: Self-pay

## 2021-01-30 DIAGNOSIS — C61 Malignant neoplasm of prostate: Secondary | ICD-10-CM | POA: Insufficient documentation

## 2021-01-30 MED ORDER — PIFLIFOLASTAT F 18 (PYLARIFY) INJECTION
9.0000 | Freq: Once | INTRAVENOUS | Status: AC
  Administered 2021-01-30: 9.45 via INTRAVENOUS

## 2021-02-02 ENCOUNTER — Other Ambulatory Visit: Payer: Self-pay

## 2021-02-02 ENCOUNTER — Ambulatory Visit
Admission: RE | Admit: 2021-02-02 | Discharge: 2021-02-02 | Disposition: A | Discharge status: Home / Self Care | Payer: Medicare HMO | Source: Ambulatory Visit | Attending: Urology | Admitting: Urology

## 2021-02-02 DIAGNOSIS — C61 Malignant neoplasm of prostate: Secondary | ICD-10-CM | POA: Diagnosis not present

## 2021-02-02 DIAGNOSIS — Z923 Personal history of irradiation: Secondary | ICD-10-CM | POA: Diagnosis not present

## 2021-02-02 DIAGNOSIS — R972 Elevated prostate specific antigen [PSA]: Secondary | ICD-10-CM | POA: Diagnosis not present

## 2021-02-02 NOTE — Progress Notes (Signed)
Radiation Oncology         (336) [phone] ________________________________  Outpatient Re-Consultation  Name: Manuel Bowers MRN: [id number]  Date: 02/02/2021  DOB: Jan 04, 1946  NU:UVOZ, Dwyane Luo, MD  Davis Gourd*   REFERRING PHYSICIAN: Davis Gourd*  DIAGNOSIS: 75 y.o. gentleman with rising PSA, currently at 4.51 s/p brachytherapy in 12/2017 for Stage T1c Gleason 4+3 prostate cancer.    ICD-10-CM   1. Malignant neoplasm of prostate ([place])  C61   2. Recurrent adenocarcinoma of prostate ([place])  C61     HISTORY OF PRESENT ILLNESS: Manuel Bowers is a 75 y.o. male with a diagnosis of prostate cancer. He was noted to have an elevated PSA of 4.69 by his primary care physician, Dr. Melinda Crutch during a yearly physical in Feb 2018. Accordingly, he was referred for evaluation in urology by Dr. Alyson Ingles on 05/30/2017, digital rectal examination was performed at that time revealing symmetric prostate lobes without discrete nodularity. A PSA was repeated on 05/30/2017 and remained elevated at 4.48. The patient proceeded to transrectal ultrasound with 12 biopsies of the prostate on 08/07/2017.  The prostate volume measured 44.96 cc. Out of 12 core biopsies, 6 were positive, all on the left. The maximum Gleason score was 4+3, and this was seen in the left mid lateral.  Gleason 3+4 was seen in the left base, left base lateral, left mid, left apex and left apex lateral.  The patient had a CT A/P and Bone scan for disease staging completed on 09/10/2017, both of which were negative for evidence of metastatic disease.   We met with the patient on 09/11/2017, and he opted to proceed with brachytherapy. The procedure was performed on 12/15/2017 with Dr. Alyson Ingles. Following the procedure, the patient's PSA reached a nadir of 0.86 in 06/2018. From that point, the PSA began to steadily rise again, reaching 4.51 in 09/2020. With his care being transferred to Dr. Lovena Neighbours, he underwent restaging CT and bone  scan on 10/24/2020. Both were negative for recurrent or metastatic disease.  He underwent repeat biopsy of the prostate on 12/04/2020. The prostate measured 27.29 cc. Out of 12 core biopsies, only 1 was positive. The right base showed a small focus (5%) of prostatic adenocarcinoma without showing significant treatment effect, Gleason 3+4.  We met back with the patient on 01/05/2021 to discuss potential salvage radiation options and the recommendation at that time was to proceed with a PSMA scan to complete disease staging and ensure that there was no evidence of distant metastatic disease.  This scan was performed on 01/30/2021 and was without evidence of metastatic adenopathy within the pelvis or periaortic retroperitoneum and no evidence of visceral or skeletal metastases.  This scan did show moderate activity within the left lobe of the prostate gland towards the apex but this was felt to be nonspecific.  The patient reviewed the recent imaging and lab results with his urologist and he has kindly been referred today for discussion of potential radiation treatment options.   PREVIOUS RADIATION THERAPY: Yes 12/15/2017: Prostate, brachytherapy / 145 Gy  PAST MEDICAL HISTORY:  Past Medical History:  Diagnosis Date  . Anisocoria   . Arthritis   . ED (erectile dysfunction)   . Elevated lipids   . Fluid in knee   . Glaucoma   . History of inguinal hernia    left  . Hypertension   . Prostate cancer ([place])   . Wears glasses       PAST SURGICAL HISTORY: Past Surgical History:  Procedure Laterality Date  . COLONOSCOPY    . CYSTOSCOPY  12/15/2017   Procedure: CYSTOSCOPY;  Surgeon: Cleon Gustin, MD;  Location: [facility];  Service: Urology;;  no seeds noted in bladder  . EYE SURGERY  2004   lt eye for glaucoma  . INGUINAL HERNIA REPAIR Left 05/24/2014   Procedure: LEFT INGUINAL HERNIA REPAIR ;  Surgeon: [name]. Georgette Dover, MD;  Location: [place];  Service:  General;  Laterality: Left;  . PROSTATE BIOPSY    . RADIOACTIVE SEED IMPLANT N/A 12/15/2017   Procedure: RADIOACTIVE SEED IMPLANT/BRACHYTHERAPY IMPLANT;  Surgeon: Cleon Gustin, MD;  Location: [facility];  Service: Urology;  Laterality: N/A;   82 seeds implanted  . SPACE OAR INSTILLATION N/A 12/15/2017   Procedure: SPACE OAR INSTILLATION;  Surgeon: Cleon Gustin, MD;  Location[facility];  Service: Urology;  Laterality: N/A;    FAMILY HISTORY:  Family History  Problem Relation Age of Onset  . Cancer Neg Hx   . Breast cancer Neg Hx   . Colon cancer Neg Hx   . Prostate cancer Neg Hx   . Pancreatic cancer Neg Hx     SOCIAL HISTORY:  Social History   Socioeconomic History  . Marital status: Married    Spouse name: Not on file  . Number of children: 5  . Years of education: Not on file  . Highest education level: Not on file  Occupational History    Comment: full time  Tobacco Use  . Smoking status: Former Smoker    Packs/day: 0.25    Years: 2.00    Pack years: 0.50    Types: Cigarettes    Quit date: 11/05/1963    Years since quitting: 57.2  . Smokeless tobacco: Never Used  Vaping Use  . Vaping Use: Never used  Substance and Sexual Activity  . Alcohol use: No  . Drug use: No  . Sexual activity: Yes  Other Topics Concern  . Not on file  Social History Narrative   75 year old male. Married. Former smoker.    Social Determinants of Health   Financial Resource Strain: Not on file  Food Insecurity: Not on file  Transportation Needs: Not on file  Physical Activity: Not on file  Stress: Not on file  Social Connections: Not on file  Intimate Partner Violence: Not on file    ALLERGIES: Patient has no known allergies.  MEDICATIONS:  Current Outpatient Medications  Medication Sig Dispense Refill  . Artificial Tear Solution (SOOTHE XP OP) Apply to eye.    Marland Kitchen atorvastatin (LIPITOR) 40 MG tablet     . atropine 1 % ophthalmic  solution Place one drop into the right eye 2 (two) times daily for 14 days. Do not take tonight.  Dr. Ander Slade will evaluate your post-operative status tomorrow and may make changes to the duration and dose of this medication.    . brimonidine (ALPHAGAN) 0.2 % ophthalmic solution Place 1 drop into both eyes 3 (three) times daily.     . brinzolamide (AZOPT) 1 % ophthalmic suspension Place one drop into the left eye 2 (two) times daily.    . dorzolamide-timolol (COSOPT) 22.3-6.8 MG/ML ophthalmic solution     . hydrochlorothiazide (HYDRODIURIL) 25 MG tablet Take 25 mg by mouth daily.    Marland Kitchen ketorolac (ACULAR) 0.5 % ophthalmic solution     . LUMIGAN 0.01 % SOLN     . neomycin-polymyxin-dexameth (MAXITROL) 0.1 % OINT  Do not use tonight!   Apply a strip of ointment the size of a grain of rice to RIGHT eye at bedtime. This must be the last eye medicine of the day.   Dr. Ander Slade will evaluate your post-operative status tomorrow and may make changes to the duration and dose of this medication.    Marland Kitchen ROCKLATAN 0.02-0.005 % SOLN     . aspirin 81 MG tablet Take 81 mg by mouth daily. (Patient not taking: Reported on 01/24/2021)     No current facility-administered medications for this encounter.    REVIEW OF SYSTEMS:  On review of systems, the patient reports that he is doing well overall. He denies any chest pain, shortness of breath, cough, fevers, chills, night sweats, unintended weight changes. He denies any bowel disturbances, and denies abdominal pain, nausea or vomiting. He denies any new musculoskeletal or joint aches or pains. His IPSS was 9 indicating mild urinary symptoms. His main complaints are nocturia x4, weak urine stream, and some frequency. A complete review of systems is obtained and is otherwise negative.   PHYSICAL EXAM:  Wt Readings from Last 3 Encounters:  01/05/21 190 lb 2 oz (86.2 kg)  12/26/17 189 lb (85.7 kg)  12/15/17 185 lb 11.2 oz (84.2 kg)   Temp Readings from Last 3 Encounters:   01/05/21 97.8 F (36.6 C) (Oral)  12/15/17 (!) 97.4 F (36.3 C)  01/14/16 97.6 F (36.4 C) (Oral)   BP Readings from Last 3 Encounters:  01/05/21 (!) 144/88  12/26/17 (!) 141/93  12/15/17 (!) 152/88   Pulse Readings from Last 3 Encounters:  01/05/21 71  12/26/17 77  12/15/17 63    /10  In general this is a well appearing African American man in no acute distress. He's alert and oriented x4 and appropriate throughout the examination. Cardiopulmonary assessment is negative for acute distress and he exhibits normal effort.    KPS = 100  100 - Normal; no complaints; no evidence of disease. 90   - Able to carry on normal activity; minor signs or symptoms of disease. 80   - Normal activity with effort; some signs or symptoms of disease. 69   - Cares for self; unable to carry on normal activity or to do active work. 60   - Requires occasional assistance, but is able to care for most of his personal needs. 50   - Requires considerable assistance and frequent medical care. 39   - Disabled; requires special care and assistance. 53   - Severely disabled; hospital admission is indicated although death not imminent. 48   - Very sick; hospital admission necessary; active supportive treatment necessary. 10   - Moribund; fatal processes progressing rapidly. 0     - Dead  Karnofsky DA, Abelmann [place], Craver LS and Burchenal [facility] [phone]) The use of the nitrogen mustards in the palliative treatment of carcinoma: with particular reference to bronchogenic carcinoma Cancer 1 634-56  LABORATORY DATA:  Lab Results  Component Value Date   WBC 3.7 (L) 12/08/2017   HGB 13.6 12/08/2017   HCT 40.6 12/08/2017   MCV 85.1 12/08/2017   PLT 198 12/08/2017   Lab Results  Component Value Date   NA 140 12/08/2017   K 4.6 12/08/2017   CL 106 12/08/2017   CO2 28 12/08/2017   Lab Results  Component Value Date   ALT 16 (L) 12/08/2017   AST 19 12/08/2017   ALKPHOS 63 12/08/2017   BILITOT 0.8 12/08/2017  RADIOGRAPHY: NM PET (F18-PYLARIFY) SKULL TO MID THIGH  Result Date: 01/30/2021 CLINICAL DATA:  Prostate carcinoma with biochemical recurrence. Status post brachytherapy. EXAM: NUCLEAR MEDICINE PET SKULL BASE TO THIGH TECHNIQUE: 9.4 mCi F18 Piflufolastat (Pylarify) was injected intravenously. Full-ring PET imaging was performed from the skull base to thigh after the radiotracer. CT data was obtained and used for attenuation correction and anatomic localization. COMPARISON:  Bone scan 1221 FINDINGS: NECK No radiotracer activity in neck lymph nodes. Incidental CT finding: None CHEST No radiotracer accumulation within mediastinal or hilar lymph nodes. No suspicious pulmonary nodules on the CT scan. Incidental CT finding: None ABDOMEN/PELVIS Prostate: Moderate activity within the LEFT lobe of the prostate gland towards the apex is nonspecific. Multiple brachytherapy seeds the prostate gland. Lymph nodes: No abnormal radiotracer accumulation within pelvic or abdominal nodes. Liver: No evidence of liver metastasis Incidental CT finding: Atherosclerotic calcification of the aorta. SKELETON No focal  activity to suggest skeletal metastasis. IMPRESSION: 1. No evidence metastatic adenopathy within the pelvis or periaortic retroperitoneum. 2. No evidence of visceral metastasis or skeletal metastasis Electronically Signed   By: Suzy Bouchard M.D.   On: 01/30/2021 16:47      IMPRESSION/PLAN: 1. 75 y.o. gentleman with rising PSA, currently at 4.51 s/p brachytherapy in 12/2017 for Stage T1c Gleason 4+3 prostate cancer.  Today, we talked to the patient about the findings and workup thus far. We discussed the PSMA scan results which confirm no additional sites of disease. We discussed the natural history of prostate cancer and general treatment, highlighting the role of salvage radiotherapy in the management of recurrences. In his case, we discussed the use of stereotactic body radiotherapy (SBRT) to deliver  additional radiation to the site(s) of recurrence versus repeat brachytherapy. We reviewed the details and logistics of delivery as well as the anticipated acute and late sequelae associated with radiation in this setting.  He appears to have a good understanding of his disease and our treatment recommendations which are of curative intent.  The patient was encouraged to ask questions that were answered to his stated satisfaction.  At the end of our conversation, the patient would like to proceed with the recommended 5 fraction course of SBRT targeting the recurrence in the prostate.  He has given verbal consent to proceed and is tentatively scheduled for CT simulation at 8:30 AM on Friday, 02/09/2021 in anticipation of beginning his treatments in the near future.  We will share our discussion with Dr. Lovena Neighbours and move forward with treatment planning for focused salvage SBRT targeting the site of recurrence.     Nicholos Johns, PA-C    Tyler Pita, MD  [facility] Direct Dial: [phone]  Fax: [phone] [email or web address]  Skype  LinkedIn

## 2021-02-09 ENCOUNTER — Ambulatory Visit
Admission: RE | Admit: 2021-02-09 | Discharge: 2021-02-09 | Disposition: A | Discharge status: Home / Self Care | Payer: Medicare HMO | Source: Ambulatory Visit | Attending: Radiation Oncology | Admitting: Radiation Oncology

## 2021-02-09 ENCOUNTER — Other Ambulatory Visit: Payer: Self-pay

## 2021-02-09 DIAGNOSIS — C61 Malignant neoplasm of prostate: Secondary | ICD-10-CM | POA: Diagnosis not present

## 2021-02-09 DIAGNOSIS — Z510 Encounter for antineoplastic radiation therapy: Secondary | ICD-10-CM | POA: Diagnosis not present

## 2021-02-09 NOTE — Addendum Note (Signed)
Encounter addended by: Tyler Pita, MD on: 02/09/2021 4:01 PM  Actions taken: Clinical Note Signed

## 2021-02-09 NOTE — Progress Notes (Addendum)
  Radiation Oncology         [phone] ________________________________  Name: Manuel Bowers MRN: [id number]  Date: 02/09/2021  DOB: 1946-08-03  STEREOTACTIC BODY RADIOTHERAPY SIMULATION AND TREATMENT PLANNING NOTE    ICD-10-CM   1. Malignant neoplasm of prostate ([place])  C61   2. Recurrent adenocarcinoma of prostate [facility])  C61     DIAGNOSIS:  75 y.o. gentleman with rising PSA, currently at 4.51 s/p brachytherapy in 12/2017 for Stage T1c Gleason 4+3 prostate cancer  NARRATIVE:  The patient was brought to the [place].  Identity was confirmed.  All relevant records and images related to the planned course of therapy were reviewed.  The patient freely provided informed written consent to proceed with treatment after reviewing the details related to the planned course of therapy. The consent form was witnessed and verified by the simulation staff.  Then, the patient was set-up in a stable reproducible  supine position for radiation therapy.  A BodyFix immobilization pillow was fabricated for reproducible positioning.  Surface markings were placed.  The CT images were loaded into the planning software.  The gross target volumes (GTV) and planning target volumes (PTV) were delinieated, and avoidance structures were contoured.  Treatment planning then occurred.  The radiation prescription was entered and confirmed.  A total of two complex treatment devices were fabricated in the form of the BodyFix immobilization pillow and a neck accuform cushion.  I have requested : 3D Simulation  I have requested a DVH of the following structures: targets and all normal structures near the target including bladder and rectum as noted on the radiation plan to maintain doses in adherence with established limits  SPECIAL TREATMENT PROCEDURE:  The planned course of therapy using radiation constitutes a special treatment procedure. Special care is required in the management of this patient for the  following reasons. High dose per fraction requiring special monitoring for increased toxicities of treatment including daily imaging..  The special nature of the planned course of radiotherapy will require increased physician supervision and oversight to ensure patient's safety with optimal treatment outcomes.  PLAN:  The patient will receive 36.25 Gy in 5 fractions while the uninvolved portion of the prostate receives 20 Gy in 5 fractions.  In terms of SBRT targeting, his right base was the only site of positive biopsy.  His recent PET showed indeterminate activity on the left side, but, biopsies there revealed no cancer and radiation changes.  Based on this, I placed the higher dose target in the right base.  ________________________________  Sheral Apley. Tammi Klippel, M.D.

## 2021-02-19 ENCOUNTER — Other Ambulatory Visit: Payer: Self-pay

## 2021-02-19 ENCOUNTER — Ambulatory Visit
Admission: RE | Admit: 2021-02-19 | Discharge: 2021-02-19 | Disposition: A | Discharge status: Home / Self Care | Payer: Medicare HMO | Source: Ambulatory Visit | Attending: Radiation Oncology | Admitting: Radiation Oncology

## 2021-02-19 DIAGNOSIS — Z510 Encounter for antineoplastic radiation therapy: Secondary | ICD-10-CM | POA: Diagnosis not present

## 2021-02-19 DIAGNOSIS — C61 Malignant neoplasm of prostate: Secondary | ICD-10-CM | POA: Diagnosis not present

## 2021-02-20 ENCOUNTER — Ambulatory Visit: Payer: Medicare HMO

## 2021-02-21 ENCOUNTER — Other Ambulatory Visit: Payer: Self-pay

## 2021-02-21 ENCOUNTER — Ambulatory Visit
Admission: RE | Admit: 2021-02-21 | Discharge: 2021-02-21 | Disposition: A | Discharge status: Home / Self Care | Payer: Medicare HMO | Source: Ambulatory Visit | Attending: Radiation Oncology | Admitting: Radiation Oncology

## 2021-02-21 DIAGNOSIS — C61 Malignant neoplasm of prostate: Secondary | ICD-10-CM | POA: Diagnosis not present

## 2021-02-21 DIAGNOSIS — Z510 Encounter for antineoplastic radiation therapy: Secondary | ICD-10-CM | POA: Diagnosis not present

## 2021-02-22 ENCOUNTER — Ambulatory Visit: Payer: Medicare HMO

## 2021-02-22 DIAGNOSIS — M150 Primary generalized (osteo)arthritis: Secondary | ICD-10-CM | POA: Diagnosis not present

## 2021-02-22 DIAGNOSIS — C61 Malignant neoplasm of prostate: Secondary | ICD-10-CM | POA: Diagnosis not present

## 2021-02-22 DIAGNOSIS — E782 Mixed hyperlipidemia: Secondary | ICD-10-CM | POA: Diagnosis not present

## 2021-02-22 DIAGNOSIS — H409 Unspecified glaucoma: Secondary | ICD-10-CM | POA: Diagnosis not present

## 2021-02-22 DIAGNOSIS — M179 Osteoarthritis of knee, unspecified: Secondary | ICD-10-CM | POA: Diagnosis not present

## 2021-02-22 DIAGNOSIS — I10 Essential (primary) hypertension: Secondary | ICD-10-CM | POA: Diagnosis not present

## 2021-02-23 ENCOUNTER — Other Ambulatory Visit: Payer: Self-pay

## 2021-02-23 ENCOUNTER — Ambulatory Visit
Admission: RE | Admit: 2021-02-23 | Discharge: 2021-02-23 | Disposition: A | Discharge status: Home / Self Care | Payer: Medicare HMO | Source: Ambulatory Visit | Attending: Radiation Oncology | Admitting: Radiation Oncology

## 2021-02-23 DIAGNOSIS — C61 Malignant neoplasm of prostate: Secondary | ICD-10-CM | POA: Diagnosis not present

## 2021-02-23 DIAGNOSIS — Z510 Encounter for antineoplastic radiation therapy: Secondary | ICD-10-CM | POA: Diagnosis not present

## 2021-02-26 ENCOUNTER — Ambulatory Visit
Admission: RE | Admit: 2021-02-26 | Discharge: 2021-02-26 | Disposition: A | Discharge status: Home / Self Care | Payer: Medicare HMO | Source: Ambulatory Visit | Attending: Radiation Oncology | Admitting: Radiation Oncology

## 2021-02-26 ENCOUNTER — Other Ambulatory Visit: Payer: Self-pay

## 2021-02-26 DIAGNOSIS — C61 Malignant neoplasm of prostate: Secondary | ICD-10-CM | POA: Diagnosis not present

## 2021-02-26 DIAGNOSIS — Z510 Encounter for antineoplastic radiation therapy: Secondary | ICD-10-CM | POA: Diagnosis not present

## 2021-02-27 ENCOUNTER — Ambulatory Visit: Payer: Medicare HMO

## 2021-02-28 ENCOUNTER — Encounter: Payer: Self-pay | Admitting: Urology

## 2021-02-28 ENCOUNTER — Ambulatory Visit
Admission: RE | Admit: 2021-02-28 | Discharge: 2021-02-28 | Disposition: A | Discharge status: Home / Self Care | Payer: Medicare HMO | Source: Ambulatory Visit | Attending: Radiation Oncology | Admitting: Radiation Oncology

## 2021-02-28 DIAGNOSIS — Z510 Encounter for antineoplastic radiation therapy: Secondary | ICD-10-CM | POA: Diagnosis not present

## 2021-02-28 DIAGNOSIS — C61 Malignant neoplasm of prostate: Secondary | ICD-10-CM | POA: Diagnosis not present

## 2021-03-01 ENCOUNTER — Ambulatory Visit: Payer: Medicare HMO

## 2021-03-06 ENCOUNTER — Telehealth: Payer: Self-pay | Admitting: Radiation Oncology

## 2021-03-06 NOTE — Telephone Encounter (Signed)
Received voicemail message from patient requesting return call about "burning." Phoned patient back to inquire and obtain further details. No answer. Left voicemail message with my direct number requesting return call.

## 2021-03-23 DIAGNOSIS — M109 Gout, unspecified: Secondary | ICD-10-CM | POA: Diagnosis not present

## 2021-03-23 DIAGNOSIS — R7303 Prediabetes: Secondary | ICD-10-CM | POA: Diagnosis not present

## 2021-03-23 DIAGNOSIS — Z23 Encounter for immunization: Secondary | ICD-10-CM | POA: Diagnosis not present

## 2021-03-23 DIAGNOSIS — E782 Mixed hyperlipidemia: Secondary | ICD-10-CM | POA: Diagnosis not present

## 2021-03-23 DIAGNOSIS — I700 Atherosclerosis of aorta: Secondary | ICD-10-CM | POA: Diagnosis not present

## 2021-03-23 DIAGNOSIS — I10 Essential (primary) hypertension: Secondary | ICD-10-CM | POA: Diagnosis not present

## 2021-03-23 DIAGNOSIS — Z0000 Encounter for general adult medical examination without abnormal findings: Secondary | ICD-10-CM | POA: Diagnosis not present

## 2021-03-28 NOTE — Progress Notes (Signed)
  Radiation Oncology         (336) [phone] ________________________________  Name: Manuel Bowers MRN: [id number]  Date: 02/28/2021  DOB: 08-17-1946  End of Treatment Note  Diagnosis:   75 y.o. gentleman with rising PSA, currently at 4.51 s/p brachytherapy in 12/2017 for Stage T1c Gleason 4+3 prostate cancer     Indication for treatment:  Curative, Definitive SBRT       Radiation treatment dates:   02/19/21 - 02/28/21  Site/dose:   The target in the right base of the prostate was treated to 36.25 Gy in 5 fractions while the uninvolved portion of the prostate received 20 Gy in 5 fractions.  Beams/energy:   The patient was treated using stereotactic body radiotherapy according to a 3D conformal radiotherapy plan.  Volumetric arc fields were employed to deliver 6 MV X-rays.  Image guidance was performed with per fraction cone beam CT prior to treatment under personal MD supervision.  Immobilization was achieved using BodyFix Pillow.  Narrative: The patient tolerated radiation treatment relatively well.  He reported mild dysuria, weak stream and nocturia x2 but specifically denied gross hematuria, straining to void, incomplete bladder emptying or incontinence.  He did not report any bowel issues or significant change in his energy level.  Plan: The patient has completed radiation treatment. The patient will return to radiation oncology clinic for routine followup in one month. I advised them to call or return sooner if they have any questions or concerns related to their recovery or treatment. ________________________________  Sheral Apley. Tammi Klippel, M.D.

## 2021-03-28 NOTE — Progress Notes (Signed)
Radiation Oncology         [phone] ________________________________  Name: Manuel Bowers MRN: [id number]  Date: 03/29/2021  DOB: Sep 16, 1946  Post Treatment Note  CC: Lawerance Cruel, MD  Davis Gourd*  Diagnosis:   75 y.o. gentleman with rising PSA, currently at 4.51 s/p brachytherapy in 12/2017 for Stage T1c Gleason 4+3 prostate cancer     Interval Since Last Radiation:  4 weeks  02/19/21 - 02/28/21:  The target in the right base of the prostate was treated to 36.25 Gy in 5 fractions while the uninvolved portion of the prostate received 20 Gy in 5 fractions.  Narrative:  I spoke with the patient to conduct his routine scheduled 1 month follow up visit via telephone to spare the patient unnecessary potential exposure in the healthcare setting during the current COVID-19 pandemic.  The patient was notified in advance and gave permission to proceed with this visit format.  He tolerated radiation treatment relatively well.  He reported mild dysuria, weak stream and nocturia x2 but specifically denied gross hematuria, straining to void, incomplete bladder emptying or incontinence.  He did not report any bowel issues or significant change in his energy level.                              On review of systems, the patient states that he is doing well in general.  His LUTS are gradually improving but he continues with nocturia 5-6 times per night, occasional urgency, weakened flow of stream, intermittency, hesitancy and frequency.  He specifically denies dysuria or gross hematuria.  He reports a healthy appetite and is maintaining his weight.  He denies any abdominal pain, nausea, vomiting, diarrhea or constipation.  He has not noticed any significant impact on his energy level and overall, is pleased with his progress to date.  ALLERGIES:  has No Known Allergies.  Meds: Current Outpatient Medications  Medication Sig Dispense Refill  . acetaZOLAMIDE (DIAMOX) 500 MG capsule     .  atorvastatin (LIPITOR) 40 MG tablet     . atropine 1 % ophthalmic solution Place one drop into the right eye 2 (two) times daily for 14 days. Do not take tonight.  Dr. Ander Slade will evaluate your post-operative status tomorrow and may make changes to the duration and dose of this medication.    . brimonidine (ALPHAGAN) 0.2 % ophthalmic solution Place 1 drop into both eyes 3 (three) times daily.     . brinzolamide (AZOPT) 1 % ophthalmic suspension Place one drop into the left eye 2 (two) times daily.    . dorzolamide-timolol (COSOPT) 22.3-6.8 MG/ML ophthalmic solution     . hydrochlorothiazide (HYDRODIURIL) 25 MG tablet Take 25 mg by mouth daily.    Marland Kitchen ketorolac (ACULAR) 0.5 % ophthalmic solution     . ROCKLATAN 0.02-0.005 % SOLN     . acetaZOLAMIDE (DIAMOX) 500 MG capsule  (Patient not taking: Reported on 03/28/2021)    . Artificial Tear Solution (SOOTHE XP OP) Apply to eye. (Patient not taking: Reported on 03/28/2021)    . aspirin 81 MG tablet Take 81 mg by mouth daily. (Patient not taking: No sig reported)    . ciprofloxacin (CILOXAN) 0.3 % ophthalmic solution Apply to eye. (Patient not taking: Reported on 03/28/2021)    . ciprofloxacin (CILOXAN) 0.3 % ophthalmic solution  (Patient not taking: Reported on 03/28/2021)    . LUMIGAN 0.01 % SOLN  (Patient not taking:  Reported on 03/28/2021)    . neomycin-polymyxin-dexameth (MAXITROL) 0.1 % OINT Do not use tonight!   Apply a strip of ointment the size of a grain of rice to RIGHT eye at bedtime. This must be the last eye medicine of the day.   Dr. Ander Slade will evaluate your post-operative status tomorrow and may make changes to the duration and dose of this medication. (Patient not taking: Reported on 03/28/2021)     No current facility-administered medications for this encounter.    Physical Findings:  vitals were not taken for this visit.   Unable to assess due to telephone follow-up visit format.  Lab Findings: Lab Results  Component Value Date   WBC  3.7 (L) 12/08/2017   HGB 13.6 12/08/2017   HCT 40.6 12/08/2017   MCV 85.1 12/08/2017   PLT 198 12/08/2017     Radiographic Findings: No results found.  Impression/Plan: 1. 75 y.o. gentleman with rising PSA, currently at 4.51 s/p brachytherapy in 12/2017 for Stage T1c Gleason 4+3 prostate cancer.  He will continue to follow up with urology for ongoing PSA determinations but does not currently have an appointment scheduled with Dr. Lovena Neighbours to his knowledge.  He was advised to call [facility] to schedule a follow-up visit if he has not heard from one of Dr. Jackson Latino staff in the next 1 to 2 weeks, anticipating a follow-up visit in July 2022.  He understands what to expect with regards to PSA monitoring going forward. I will look forward to following his response to treatment via correspondence with urology, and would be happy to continue to participate in his care if clinically indicated. I talked to the patient about what to expect in the future, including his risk for erectile dysfunction and rectal bleeding. I encouraged him to call or return to the office if he has any questions regarding his previous radiation or possible radiation side effects. He was comfortable with this plan and will follow up as needed.    Nicholos Johns, PA-C

## 2021-03-28 NOTE — Progress Notes (Signed)
Patient reports dysuria. Patient denies any hematuria or leakage. Patient reports nocturia x 5-6. Patient reports some urgency. Patient states that he does not empty his bladder with urination.Patient reports a weak stream. Patient states that he has to push and strain to start his stream. Patient states that sometime he voids continously and sometime he stop and go.Patient denies any issues with his bowels.Patients states that voids 3 times in a 2 hour period. Patient states that he will schedule a follow-up appointment with his urologist. Patient is aware that this is a phone visit and meaningful use is complete.Patient IPPS was a  69

## 2021-03-29 ENCOUNTER — Other Ambulatory Visit: Payer: Self-pay

## 2021-03-29 ENCOUNTER — Ambulatory Visit
Admission: RE | Admit: 2021-03-29 | Discharge: 2021-03-29 | Disposition: A | Discharge status: Home / Self Care | Payer: Medicare HMO | Source: Ambulatory Visit | Attending: Urology | Admitting: Urology

## 2021-03-29 DIAGNOSIS — C61 Malignant neoplasm of prostate: Secondary | ICD-10-CM

## 2021-04-10 DIAGNOSIS — H5440 Blindness, one eye, unspecified eye: Secondary | ICD-10-CM | POA: Diagnosis not present

## 2021-04-10 DIAGNOSIS — H5450 Low vision, one eye, unspecified eye: Secondary | ICD-10-CM | POA: Diagnosis not present

## 2021-04-10 DIAGNOSIS — Z961 Presence of intraocular lens: Secondary | ICD-10-CM | POA: Diagnosis not present

## 2021-04-10 DIAGNOSIS — H35373 Puckering of macula, bilateral: Secondary | ICD-10-CM | POA: Diagnosis not present

## 2021-04-10 DIAGNOSIS — H401133 Primary open-angle glaucoma, bilateral, severe stage: Secondary | ICD-10-CM | POA: Diagnosis not present

## 2021-04-10 DIAGNOSIS — H30003 Unspecified focal chorioretinal inflammation, bilateral: Secondary | ICD-10-CM | POA: Diagnosis not present

## 2021-04-10 DIAGNOSIS — Z79899 Other long term (current) drug therapy: Secondary | ICD-10-CM | POA: Diagnosis not present

## 2021-04-27 DIAGNOSIS — Z79899 Other long term (current) drug therapy: Secondary | ICD-10-CM | POA: Diagnosis not present

## 2021-04-27 DIAGNOSIS — H30003 Unspecified focal chorioretinal inflammation, bilateral: Secondary | ICD-10-CM | POA: Diagnosis not present

## 2021-06-14 DIAGNOSIS — C61 Malignant neoplasm of prostate: Secondary | ICD-10-CM | POA: Diagnosis not present

## 2021-06-18 DIAGNOSIS — I10 Essential (primary) hypertension: Secondary | ICD-10-CM | POA: Diagnosis not present

## 2021-06-18 DIAGNOSIS — H409 Unspecified glaucoma: Secondary | ICD-10-CM | POA: Diagnosis not present

## 2021-06-18 DIAGNOSIS — C61 Malignant neoplasm of prostate: Secondary | ICD-10-CM | POA: Diagnosis not present

## 2021-06-18 DIAGNOSIS — M150 Primary generalized (osteo)arthritis: Secondary | ICD-10-CM | POA: Diagnosis not present

## 2021-06-18 DIAGNOSIS — M179 Osteoarthritis of knee, unspecified: Secondary | ICD-10-CM | POA: Diagnosis not present

## 2021-06-18 DIAGNOSIS — E782 Mixed hyperlipidemia: Secondary | ICD-10-CM | POA: Diagnosis not present

## 2021-06-21 DIAGNOSIS — H401123 Primary open-angle glaucoma, left eye, severe stage: Secondary | ICD-10-CM | POA: Diagnosis not present

## 2021-06-21 DIAGNOSIS — H401133 Primary open-angle glaucoma, bilateral, severe stage: Secondary | ICD-10-CM | POA: Diagnosis not present

## 2021-06-21 DIAGNOSIS — H401113 Primary open-angle glaucoma, right eye, severe stage: Secondary | ICD-10-CM | POA: Diagnosis not present

## 2021-06-21 DIAGNOSIS — C61 Malignant neoplasm of prostate: Secondary | ICD-10-CM | POA: Diagnosis not present

## 2021-06-21 DIAGNOSIS — R9721 Rising PSA following treatment for malignant neoplasm of prostate: Secondary | ICD-10-CM | POA: Diagnosis not present

## 2021-09-18 DIAGNOSIS — H35373 Puckering of macula, bilateral: Secondary | ICD-10-CM | POA: Diagnosis not present

## 2021-09-18 DIAGNOSIS — Z79899 Other long term (current) drug therapy: Secondary | ICD-10-CM | POA: Diagnosis not present

## 2021-09-18 DIAGNOSIS — H401133 Primary open-angle glaucoma, bilateral, severe stage: Secondary | ICD-10-CM | POA: Diagnosis not present

## 2021-09-18 DIAGNOSIS — H5450 Low vision, one eye, unspecified eye: Secondary | ICD-10-CM | POA: Diagnosis not present

## 2021-09-18 DIAGNOSIS — H5440 Blindness, one eye, unspecified eye: Secondary | ICD-10-CM | POA: Diagnosis not present

## 2021-09-18 DIAGNOSIS — Z961 Presence of intraocular lens: Secondary | ICD-10-CM | POA: Diagnosis not present

## 2021-10-22 DIAGNOSIS — H401133 Primary open-angle glaucoma, bilateral, severe stage: Secondary | ICD-10-CM | POA: Diagnosis not present

## 2021-10-22 DIAGNOSIS — H401113 Primary open-angle glaucoma, right eye, severe stage: Secondary | ICD-10-CM | POA: Diagnosis not present

## 2021-10-22 DIAGNOSIS — H401123 Primary open-angle glaucoma, left eye, severe stage: Secondary | ICD-10-CM | POA: Diagnosis not present

## 2021-10-24 DIAGNOSIS — C61 Malignant neoplasm of prostate: Secondary | ICD-10-CM | POA: Diagnosis not present

## 2021-10-24 DIAGNOSIS — Z79899 Other long term (current) drug therapy: Secondary | ICD-10-CM | POA: Diagnosis not present

## 2021-10-24 DIAGNOSIS — H30003 Unspecified focal chorioretinal inflammation, bilateral: Secondary | ICD-10-CM | POA: Diagnosis not present

## 2021-11-20 DIAGNOSIS — H401130 Primary open-angle glaucoma, bilateral, stage unspecified: Secondary | ICD-10-CM | POA: Diagnosis not present

## 2021-12-11 DIAGNOSIS — Z961 Presence of intraocular lens: Secondary | ICD-10-CM | POA: Diagnosis not present

## 2021-12-11 DIAGNOSIS — H35373 Puckering of macula, bilateral: Secondary | ICD-10-CM | POA: Diagnosis not present

## 2021-12-11 DIAGNOSIS — Z79899 Other long term (current) drug therapy: Secondary | ICD-10-CM | POA: Diagnosis not present

## 2021-12-11 DIAGNOSIS — H401133 Primary open-angle glaucoma, bilateral, severe stage: Secondary | ICD-10-CM | POA: Diagnosis not present

## 2021-12-11 DIAGNOSIS — H5450 Low vision, one eye, unspecified eye: Secondary | ICD-10-CM | POA: Diagnosis not present

## 2021-12-11 DIAGNOSIS — H5440 Blindness, one eye, unspecified eye: Secondary | ICD-10-CM | POA: Diagnosis not present

## 2021-12-11 DIAGNOSIS — H30003 Unspecified focal chorioretinal inflammation, bilateral: Secondary | ICD-10-CM | POA: Diagnosis not present

## 2021-12-27 DIAGNOSIS — R310 Gross hematuria: Secondary | ICD-10-CM | POA: Diagnosis not present

## 2021-12-27 DIAGNOSIS — C61 Malignant neoplasm of prostate: Secondary | ICD-10-CM | POA: Diagnosis not present

## 2022-01-25 DIAGNOSIS — R310 Gross hematuria: Secondary | ICD-10-CM | POA: Diagnosis not present

## 2022-04-08 DIAGNOSIS — H401133 Primary open-angle glaucoma, bilateral, severe stage: Secondary | ICD-10-CM | POA: Diagnosis not present

## 2022-04-08 DIAGNOSIS — E782 Mixed hyperlipidemia: Secondary | ICD-10-CM | POA: Diagnosis not present

## 2022-04-08 DIAGNOSIS — R7303 Prediabetes: Secondary | ICD-10-CM | POA: Diagnosis not present

## 2022-04-08 DIAGNOSIS — H401123 Primary open-angle glaucoma, left eye, severe stage: Secondary | ICD-10-CM | POA: Diagnosis not present

## 2022-04-08 DIAGNOSIS — I10 Essential (primary) hypertension: Secondary | ICD-10-CM | POA: Diagnosis not present

## 2022-04-08 DIAGNOSIS — H401113 Primary open-angle glaucoma, right eye, severe stage: Secondary | ICD-10-CM | POA: Diagnosis not present

## 2022-04-08 DIAGNOSIS — M109 Gout, unspecified: Secondary | ICD-10-CM | POA: Diagnosis not present

## 2022-04-26 DIAGNOSIS — M1711 Unilateral primary osteoarthritis, right knee: Secondary | ICD-10-CM | POA: Diagnosis not present

## 2022-04-26 DIAGNOSIS — R7303 Prediabetes: Secondary | ICD-10-CM | POA: Diagnosis not present

## 2022-04-26 DIAGNOSIS — E782 Mixed hyperlipidemia: Secondary | ICD-10-CM | POA: Diagnosis not present

## 2022-04-26 DIAGNOSIS — I700 Atherosclerosis of aorta: Secondary | ICD-10-CM | POA: Diagnosis not present

## 2022-04-26 DIAGNOSIS — M109 Gout, unspecified: Secondary | ICD-10-CM | POA: Diagnosis not present

## 2022-04-26 DIAGNOSIS — I10 Essential (primary) hypertension: Secondary | ICD-10-CM | POA: Diagnosis not present

## 2022-04-26 DIAGNOSIS — Z0000 Encounter for general adult medical examination without abnormal findings: Secondary | ICD-10-CM | POA: Diagnosis not present

## 2022-05-03 DIAGNOSIS — R310 Gross hematuria: Secondary | ICD-10-CM | POA: Diagnosis not present

## 2022-05-06 ENCOUNTER — Other Ambulatory Visit (HOSPITAL_COMMUNITY): Payer: Self-pay | Admitting: Urology

## 2022-05-06 DIAGNOSIS — R9721 Rising PSA following treatment for malignant neoplasm of prostate: Secondary | ICD-10-CM

## 2022-05-13 ENCOUNTER — Encounter (HOSPITAL_COMMUNITY)
Admission: RE | Admit: 2022-05-13 | Discharge: 2022-05-13 | Disposition: A | Discharge status: Home / Self Care | Payer: Medicare HMO | Source: Ambulatory Visit | Attending: Urology | Admitting: Urology

## 2022-05-13 DIAGNOSIS — C61 Malignant neoplasm of prostate: Secondary | ICD-10-CM | POA: Diagnosis not present

## 2022-05-13 DIAGNOSIS — R9721 Rising PSA following treatment for malignant neoplasm of prostate: Secondary | ICD-10-CM | POA: Diagnosis not present

## 2022-05-13 MED ORDER — PIFLIFOLASTAT F 18 (PYLARIFY) INJECTION
9.0000 | Freq: Once | INTRAVENOUS | Status: AC
  Administered 2022-05-13: 9.58 via INTRAVENOUS

## 2022-05-20 IMAGING — CT NM PET TUM IMG SKULL BASE T - THIGH
7 series · 25 of 25 positions shown · non-contrast
Comparison: Bone scan 9559

CLINICAL DATA: Prostate carcinoma with biochemical recurrence.
Status post brachytherapy.

EXAM:
NUCLEAR MEDICINE PET SKULL BASE TO THIGH
TECHNIQUE: 9.4 mCi F18 Piflufolastat (Pylarify) was injected intravenously.
Full-ring PET imaging was performed from the skull base to thigh
after the radiotracer. CT data was obtained and used for attenuation
correction and anatomic localization.

[Series 3: pet sk_thigh ac · axial · 5.0mm · 4.07mm/px · z∈[-834,+146]mm · 6 of 246 slices shown]
[im 1/246]
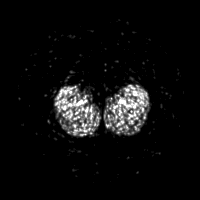
[im 50/246]
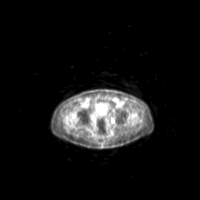
[im 99/246]
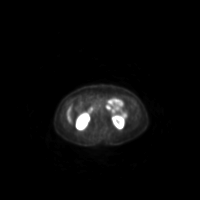
[im 148/246]
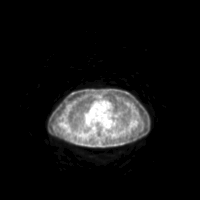
[im 197/246]
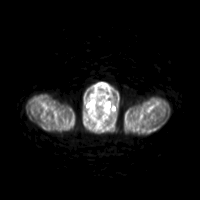
[im 246/246]
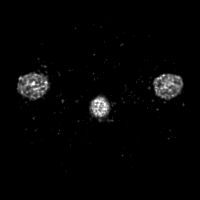

[Series 4: ct sk_thigh 5.0 bf37 · axial · 5.0mm · 0.98mm/px · z∈[-834,+146]mm · 5 of 246 slices shown]
[im 1/246]
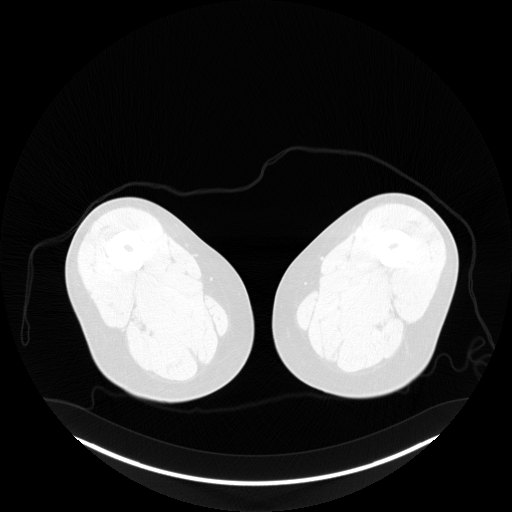
[im 62/246]
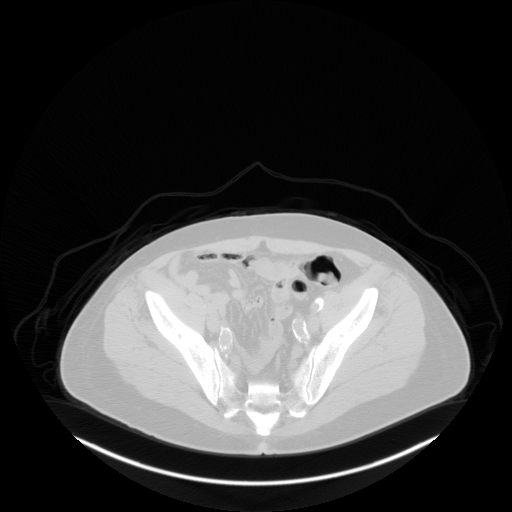
[im 123/246]
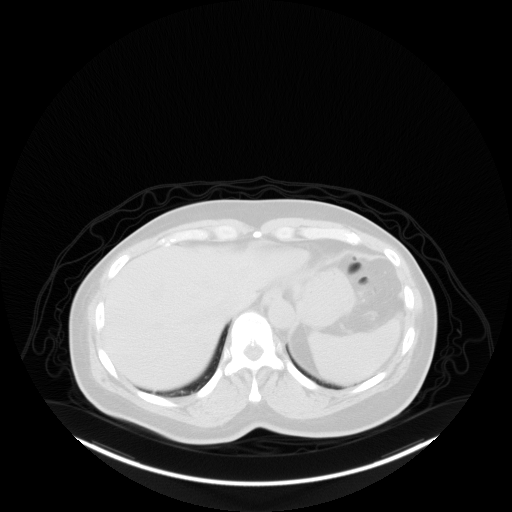
[im 184/246]
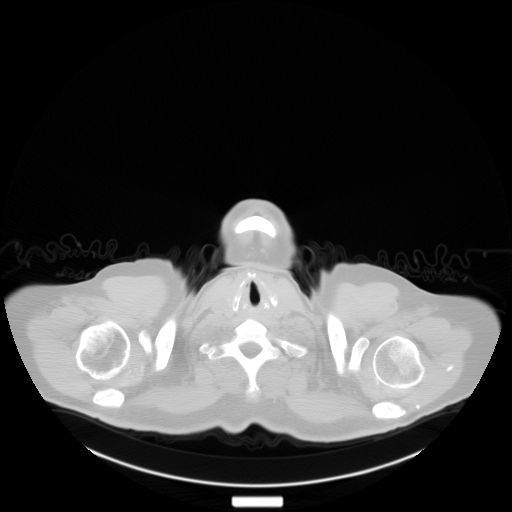
[im 246/246]
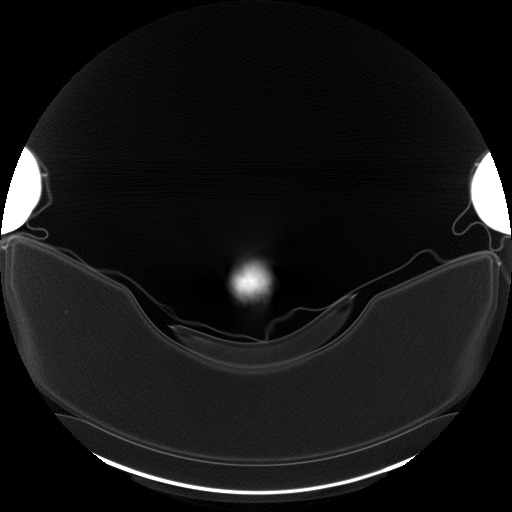

[Series 5: pet sk_thigh nac · axial · 5.0mm · 4.07mm/px · z∈[-834,+146]mm · 5 of 246 slices shown]
[im 1/246]
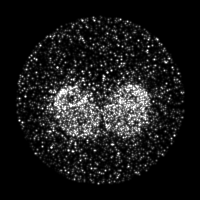
[im 62/246]
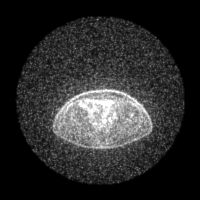
[im 123/246]
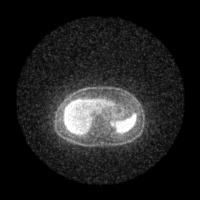
[im 184/246]
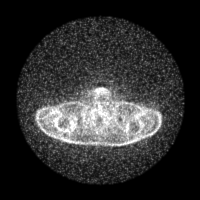
[im 246/246]
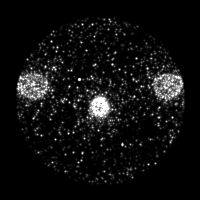

[Series 8: ct sk_thigh 5.0 br59 (id)_bone · axial · 5.0mm · 0.79mm/px · z∈[-384,-68]mm · 2 of 80 slices shown]
[im 1/80]
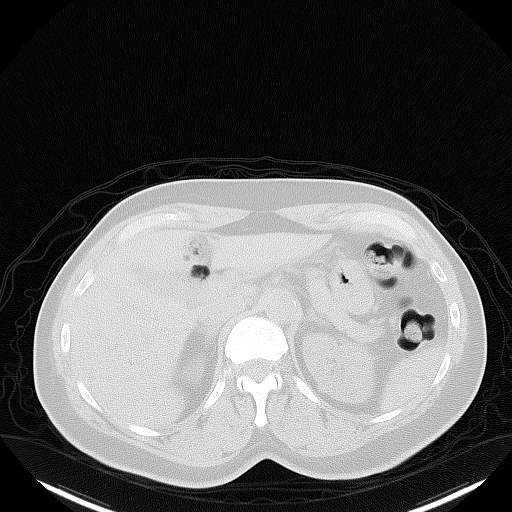
[im 80/80]
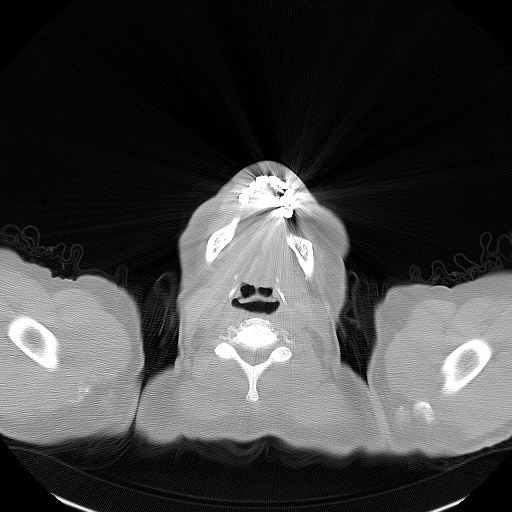

[Series 603: fused cor · 1 of 52 slices shown]
[im 1/52]
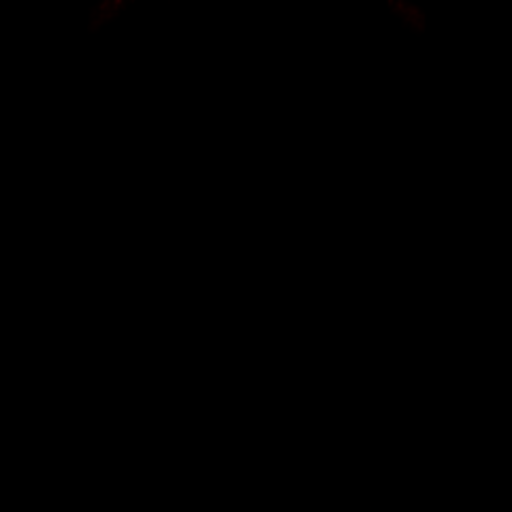

[Series 604: <mip collection> · coronal · 2.03mm/px · 1 of 32 slices shown]
[im 1/32]
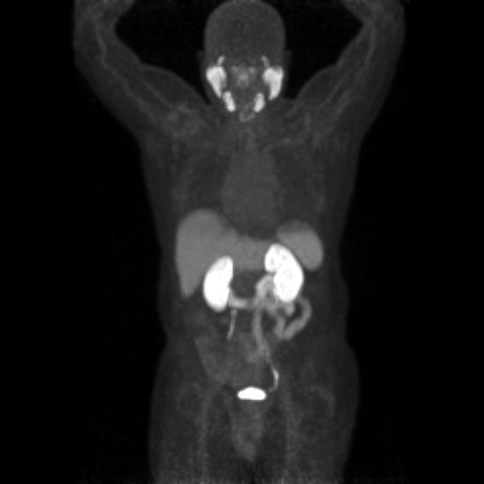

[Series 605: range-ct sk_thigh 5.0 bf37-tra-<alpha range> · 5 of 240 slices shown]
[im 1/240]
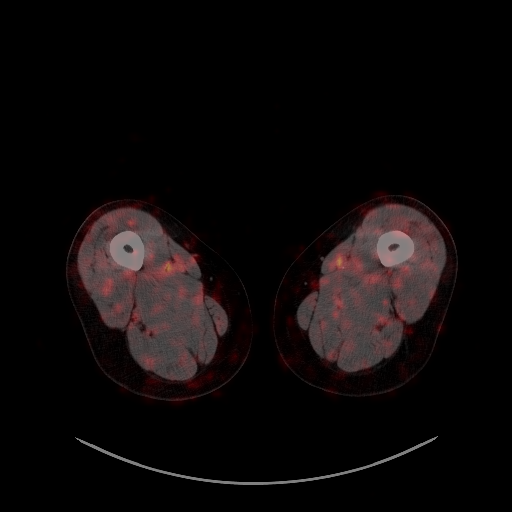
[im 60/240]
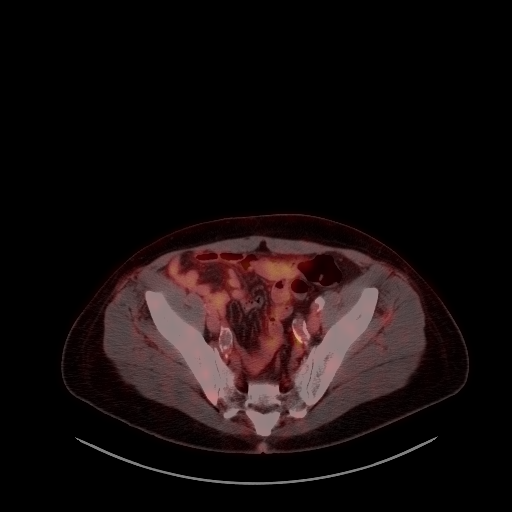
[im 120/240]
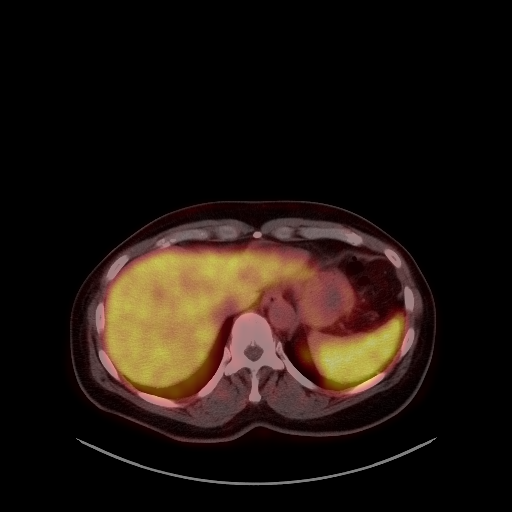
[im 180/240]
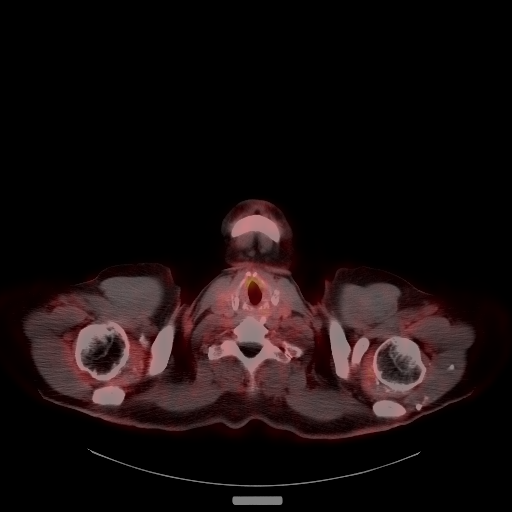
[im 240/240]
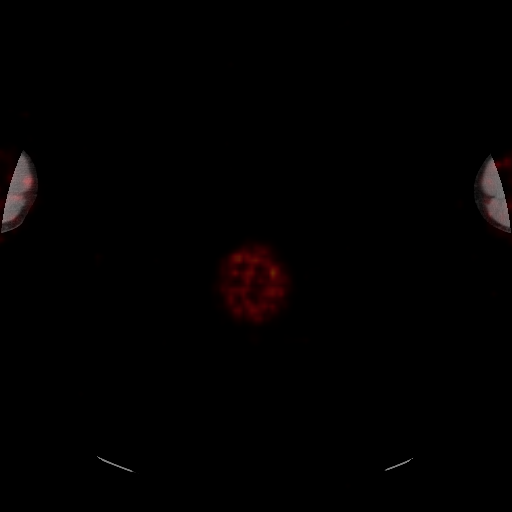

[25 of 25 positions shown; findings below may reference images not displayed]

FINDINGS: NECK

No radiotracer activity in neck lymph nodes.

Incidental CT finding: None

CHEST

No radiotracer accumulation within mediastinal or hilar lymph nodes.
No suspicious pulmonary nodules on the CT scan.

Incidental CT finding: None

ABDOMEN/PELVIS

Prostate: Moderate activity within the LEFT lobe of the prostate
gland towards the apex is nonspecific. Multiple brachytherapy seeds
the prostate gland.

Lymph nodes: No abnormal radiotracer accumulation within pelvic or
abdominal nodes.

Liver: No evidence of liver metastasis

Incidental CT finding: Atherosclerotic calcification of the aorta.

SKELETON

No focal  activity to suggest skeletal metastasis.
IMPRESSION: 1. No evidence metastatic adenopathy within the pelvis or periaortic
retroperitoneum.
2. No evidence of visceral metastasis or skeletal metastasis

## 2022-05-31 DIAGNOSIS — C61 Malignant neoplasm of prostate: Secondary | ICD-10-CM | POA: Diagnosis not present

## 2022-06-18 DIAGNOSIS — H30003 Unspecified focal chorioretinal inflammation, bilateral: Secondary | ICD-10-CM | POA: Diagnosis not present

## 2022-06-18 DIAGNOSIS — Z961 Presence of intraocular lens: Secondary | ICD-10-CM | POA: Diagnosis not present

## 2022-06-18 DIAGNOSIS — H401133 Primary open-angle glaucoma, bilateral, severe stage: Secondary | ICD-10-CM | POA: Diagnosis not present

## 2022-06-18 DIAGNOSIS — H35373 Puckering of macula, bilateral: Secondary | ICD-10-CM | POA: Diagnosis not present

## 2022-06-18 DIAGNOSIS — Z79899 Other long term (current) drug therapy: Secondary | ICD-10-CM | POA: Diagnosis not present

## 2022-06-18 DIAGNOSIS — H5440 Blindness, one eye, unspecified eye: Secondary | ICD-10-CM | POA: Diagnosis not present

## 2022-06-18 DIAGNOSIS — H5450 Low vision, one eye, unspecified eye: Secondary | ICD-10-CM | POA: Diagnosis not present

## 2022-07-02 DIAGNOSIS — Z79899 Other long term (current) drug therapy: Secondary | ICD-10-CM | POA: Diagnosis not present

## 2022-07-02 DIAGNOSIS — H30003 Unspecified focal chorioretinal inflammation, bilateral: Secondary | ICD-10-CM | POA: Diagnosis not present

## 2022-07-15 DIAGNOSIS — C61 Malignant neoplasm of prostate: Secondary | ICD-10-CM | POA: Diagnosis not present

## 2022-08-27 DIAGNOSIS — R9721 Rising PSA following treatment for malignant neoplasm of prostate: Secondary | ICD-10-CM | POA: Diagnosis not present

## 2022-09-20 DIAGNOSIS — H401133 Primary open-angle glaucoma, bilateral, severe stage: Secondary | ICD-10-CM | POA: Diagnosis not present

## 2022-09-20 DIAGNOSIS — H30003 Unspecified focal chorioretinal inflammation, bilateral: Secondary | ICD-10-CM | POA: Diagnosis not present

## 2022-09-20 DIAGNOSIS — Z961 Presence of intraocular lens: Secondary | ICD-10-CM | POA: Diagnosis not present

## 2022-09-20 DIAGNOSIS — H5450 Low vision, one eye, unspecified eye: Secondary | ICD-10-CM | POA: Diagnosis not present

## 2022-09-20 DIAGNOSIS — Z79899 Other long term (current) drug therapy: Secondary | ICD-10-CM | POA: Diagnosis not present

## 2022-09-20 DIAGNOSIS — H5440 Blindness, one eye, unspecified eye: Secondary | ICD-10-CM | POA: Diagnosis not present

## 2022-09-20 DIAGNOSIS — H35373 Puckering of macula, bilateral: Secondary | ICD-10-CM | POA: Diagnosis not present

## 2022-09-23 DIAGNOSIS — H401133 Primary open-angle glaucoma, bilateral, severe stage: Secondary | ICD-10-CM | POA: Diagnosis not present

## 2022-10-08 DIAGNOSIS — H30003 Unspecified focal chorioretinal inflammation, bilateral: Secondary | ICD-10-CM | POA: Diagnosis not present

## 2022-10-08 DIAGNOSIS — Z79899 Other long term (current) drug therapy: Secondary | ICD-10-CM | POA: Diagnosis not present

## 2022-12-03 DIAGNOSIS — H5450 Low vision, one eye, unspecified eye: Secondary | ICD-10-CM | POA: Diagnosis not present

## 2022-12-03 DIAGNOSIS — H5440 Blindness, one eye, unspecified eye: Secondary | ICD-10-CM | POA: Diagnosis not present

## 2022-12-03 DIAGNOSIS — Z961 Presence of intraocular lens: Secondary | ICD-10-CM | POA: Diagnosis not present

## 2022-12-03 DIAGNOSIS — H35373 Puckering of macula, bilateral: Secondary | ICD-10-CM | POA: Diagnosis not present

## 2022-12-03 DIAGNOSIS — H401133 Primary open-angle glaucoma, bilateral, severe stage: Secondary | ICD-10-CM | POA: Diagnosis not present

## 2022-12-03 DIAGNOSIS — Z79899 Other long term (current) drug therapy: Secondary | ICD-10-CM | POA: Diagnosis not present

## 2022-12-03 DIAGNOSIS — H30003 Unspecified focal chorioretinal inflammation, bilateral: Secondary | ICD-10-CM | POA: Diagnosis not present

## 2022-12-17 DIAGNOSIS — Z79899 Other long term (current) drug therapy: Secondary | ICD-10-CM | POA: Diagnosis not present

## 2022-12-17 DIAGNOSIS — H30003 Unspecified focal chorioretinal inflammation, bilateral: Secondary | ICD-10-CM | POA: Diagnosis not present

## 2023-01-16 DIAGNOSIS — R9721 Rising PSA following treatment for malignant neoplasm of prostate: Secondary | ICD-10-CM | POA: Diagnosis not present

## 2023-01-23 DIAGNOSIS — C61 Malignant neoplasm of prostate: Secondary | ICD-10-CM | POA: Diagnosis not present

## 2023-01-30 DIAGNOSIS — M79671 Pain in right foot: Secondary | ICD-10-CM | POA: Diagnosis not present

## 2023-03-03 DIAGNOSIS — H401133 Primary open-angle glaucoma, bilateral, severe stage: Secondary | ICD-10-CM | POA: Diagnosis not present

## 2023-05-20 DIAGNOSIS — Z0000 Encounter for general adult medical examination without abnormal findings: Secondary | ICD-10-CM | POA: Diagnosis not present

## 2023-05-20 DIAGNOSIS — I700 Atherosclerosis of aorta: Secondary | ICD-10-CM | POA: Diagnosis not present

## 2023-05-20 DIAGNOSIS — M109 Gout, unspecified: Secondary | ICD-10-CM | POA: Diagnosis not present

## 2023-05-20 DIAGNOSIS — C61 Malignant neoplasm of prostate: Secondary | ICD-10-CM | POA: Diagnosis not present

## 2023-05-20 DIAGNOSIS — E782 Mixed hyperlipidemia: Secondary | ICD-10-CM | POA: Diagnosis not present

## 2023-05-20 DIAGNOSIS — R7303 Prediabetes: Secondary | ICD-10-CM | POA: Diagnosis not present

## 2023-05-20 DIAGNOSIS — Z6827 Body mass index (BMI) 27.0-27.9, adult: Secondary | ICD-10-CM | POA: Diagnosis not present

## 2023-05-20 DIAGNOSIS — I10 Essential (primary) hypertension: Secondary | ICD-10-CM | POA: Diagnosis not present

## 2023-06-10 DIAGNOSIS — Z79899 Other long term (current) drug therapy: Secondary | ICD-10-CM | POA: Diagnosis not present

## 2023-06-10 DIAGNOSIS — H5410 Blindness, one eye, low vision other eye, unspecified eyes: Secondary | ICD-10-CM | POA: Diagnosis not present

## 2023-06-10 DIAGNOSIS — H30003 Unspecified focal chorioretinal inflammation, bilateral: Secondary | ICD-10-CM | POA: Diagnosis not present

## 2023-06-10 DIAGNOSIS — H401133 Primary open-angle glaucoma, bilateral, severe stage: Secondary | ICD-10-CM | POA: Diagnosis not present

## 2023-06-10 DIAGNOSIS — H35373 Puckering of macula, bilateral: Secondary | ICD-10-CM | POA: Diagnosis not present

## 2023-06-10 DIAGNOSIS — Z961 Presence of intraocular lens: Secondary | ICD-10-CM | POA: Diagnosis not present

## 2023-06-13 DIAGNOSIS — Z79899 Other long term (current) drug therapy: Secondary | ICD-10-CM | POA: Diagnosis not present

## 2023-06-13 DIAGNOSIS — H30003 Unspecified focal chorioretinal inflammation, bilateral: Secondary | ICD-10-CM | POA: Diagnosis not present

## 2023-07-24 DIAGNOSIS — C61 Malignant neoplasm of prostate: Secondary | ICD-10-CM | POA: Diagnosis not present

## 2023-08-08 DIAGNOSIS — C61 Malignant neoplasm of prostate: Secondary | ICD-10-CM | POA: Diagnosis not present

## 2023-08-11 DIAGNOSIS — H401133 Primary open-angle glaucoma, bilateral, severe stage: Secondary | ICD-10-CM | POA: Diagnosis not present

## 2023-08-14 ENCOUNTER — Other Ambulatory Visit: Payer: Self-pay | Admitting: Urology

## 2023-08-14 DIAGNOSIS — C61 Malignant neoplasm of prostate: Secondary | ICD-10-CM

## 2024-01-17 DIAGNOSIS — H401133 Primary open-angle glaucoma, bilateral, severe stage: Secondary | ICD-10-CM | POA: Diagnosis not present

## 2024-03-12 ENCOUNTER — Ambulatory Visit
Admission: RE | Admit: 2024-03-12 | Discharge: 2024-03-12 | Disposition: A | Discharge status: Home / Self Care | Payer: Medicare HMO | Source: Ambulatory Visit | Attending: Urology | Admitting: Urology

## 2024-03-12 DIAGNOSIS — M1990 Unspecified osteoarthritis, unspecified site: Secondary | ICD-10-CM | POA: Diagnosis not present

## 2024-03-12 DIAGNOSIS — C61 Malignant neoplasm of prostate: Secondary | ICD-10-CM | POA: Diagnosis not present

## 2024-03-18 DIAGNOSIS — C61 Malignant neoplasm of prostate: Secondary | ICD-10-CM | POA: Diagnosis not present

## 2024-03-23 ENCOUNTER — Other Ambulatory Visit (HOSPITAL_COMMUNITY): Payer: Self-pay | Admitting: Urology

## 2024-03-23 DIAGNOSIS — Z79899 Other long term (current) drug therapy: Secondary | ICD-10-CM | POA: Diagnosis not present

## 2024-03-23 DIAGNOSIS — Z961 Presence of intraocular lens: Secondary | ICD-10-CM | POA: Diagnosis not present

## 2024-03-23 DIAGNOSIS — H35373 Puckering of macula, bilateral: Secondary | ICD-10-CM | POA: Diagnosis not present

## 2024-03-23 DIAGNOSIS — R9721 Rising PSA following treatment for malignant neoplasm of prostate: Secondary | ICD-10-CM

## 2024-03-23 DIAGNOSIS — H5410 Blindness, one eye, low vision other eye, unspecified eyes: Secondary | ICD-10-CM | POA: Diagnosis not present

## 2024-03-23 DIAGNOSIS — H30003 Unspecified focal chorioretinal inflammation, bilateral: Secondary | ICD-10-CM | POA: Diagnosis not present

## 2024-03-23 DIAGNOSIS — H401133 Primary open-angle glaucoma, bilateral, severe stage: Secondary | ICD-10-CM | POA: Diagnosis not present

## 2024-04-12 ENCOUNTER — Encounter (HOSPITAL_COMMUNITY)
Admission: RE | Admit: 2024-04-12 | Discharge: 2024-04-12 | Disposition: A | Discharge status: Home / Self Care | Source: Ambulatory Visit | Attending: Urology | Admitting: Urology

## 2024-04-12 DIAGNOSIS — R9721 Rising PSA following treatment for malignant neoplasm of prostate: Secondary | ICD-10-CM | POA: Insufficient documentation

## 2024-04-12 DIAGNOSIS — C61 Malignant neoplasm of prostate: Secondary | ICD-10-CM | POA: Diagnosis not present

## 2024-04-12 MED ORDER — FLOTUFOLASTAT F 18 GALLIUM 296-5846 MBQ/ML IV SOLN
8.0000 | Freq: Once | INTRAVENOUS | Status: AC
Start: 2024-04-12 — End: 2024-04-12
  Administered 2024-04-12: 8 via INTRAVENOUS

## 2024-04-15 DIAGNOSIS — Z79899 Other long term (current) drug therapy: Secondary | ICD-10-CM | POA: Diagnosis not present

## 2024-04-15 DIAGNOSIS — H30003 Unspecified focal chorioretinal inflammation, bilateral: Secondary | ICD-10-CM | POA: Diagnosis not present

## 2024-04-28 DIAGNOSIS — C61 Malignant neoplasm of prostate: Secondary | ICD-10-CM | POA: Diagnosis not present

## 2024-05-13 DIAGNOSIS — C778 Secondary and unspecified malignant neoplasm of lymph nodes of multiple regions: Secondary | ICD-10-CM | POA: Diagnosis not present

## 2024-05-13 DIAGNOSIS — C61 Malignant neoplasm of prostate: Secondary | ICD-10-CM | POA: Diagnosis not present

## 2024-05-13 DIAGNOSIS — R3912 Poor urinary stream: Secondary | ICD-10-CM | POA: Diagnosis not present

## 2024-05-17 DIAGNOSIS — H401133 Primary open-angle glaucoma, bilateral, severe stage: Secondary | ICD-10-CM | POA: Diagnosis not present

## 2024-06-22 DIAGNOSIS — H35373 Puckering of macula, bilateral: Secondary | ICD-10-CM | POA: Diagnosis not present

## 2024-06-22 DIAGNOSIS — H401133 Primary open-angle glaucoma, bilateral, severe stage: Secondary | ICD-10-CM | POA: Diagnosis not present

## 2024-06-22 DIAGNOSIS — H5410 Blindness, one eye, low vision other eye, unspecified eyes: Secondary | ICD-10-CM | POA: Diagnosis not present

## 2024-06-22 DIAGNOSIS — H30003 Unspecified focal chorioretinal inflammation, bilateral: Secondary | ICD-10-CM | POA: Diagnosis not present

## 2024-06-22 DIAGNOSIS — Z961 Presence of intraocular lens: Secondary | ICD-10-CM | POA: Diagnosis not present

## 2024-06-22 DIAGNOSIS — Z79899 Other long term (current) drug therapy: Secondary | ICD-10-CM | POA: Diagnosis not present

## 2024-06-23 DIAGNOSIS — I10 Essential (primary) hypertension: Secondary | ICD-10-CM | POA: Diagnosis not present

## 2024-06-23 DIAGNOSIS — E782 Mixed hyperlipidemia: Secondary | ICD-10-CM | POA: Diagnosis not present

## 2024-06-23 DIAGNOSIS — Z23 Encounter for immunization: Secondary | ICD-10-CM | POA: Diagnosis not present

## 2024-06-23 DIAGNOSIS — Z6828 Body mass index (BMI) 28.0-28.9, adult: Secondary | ICD-10-CM | POA: Diagnosis not present

## 2024-06-23 DIAGNOSIS — M179 Osteoarthritis of knee, unspecified: Secondary | ICD-10-CM | POA: Diagnosis not present

## 2024-06-23 DIAGNOSIS — M109 Gout, unspecified: Secondary | ICD-10-CM | POA: Diagnosis not present

## 2024-06-23 DIAGNOSIS — Z0000 Encounter for general adult medical examination without abnormal findings: Secondary | ICD-10-CM | POA: Diagnosis not present

## 2024-06-23 DIAGNOSIS — C61 Malignant neoplasm of prostate: Secondary | ICD-10-CM | POA: Diagnosis not present

## 2024-06-23 DIAGNOSIS — R7303 Prediabetes: Secondary | ICD-10-CM | POA: Diagnosis not present

## 2024-06-28 DIAGNOSIS — Z6829 Body mass index (BMI) 29.0-29.9, adult: Secondary | ICD-10-CM | POA: Diagnosis not present

## 2024-06-28 DIAGNOSIS — E119 Type 2 diabetes mellitus without complications: Secondary | ICD-10-CM | POA: Diagnosis not present

## 2024-06-28 DIAGNOSIS — R030 Elevated blood-pressure reading, without diagnosis of hypertension: Secondary | ICD-10-CM | POA: Diagnosis not present

## 2024-07-20 DIAGNOSIS — Z79899 Other long term (current) drug therapy: Secondary | ICD-10-CM | POA: Diagnosis not present

## 2024-07-20 DIAGNOSIS — H30003 Unspecified focal chorioretinal inflammation, bilateral: Secondary | ICD-10-CM | POA: Diagnosis not present

## 2024-08-28 DIAGNOSIS — R32 Unspecified urinary incontinence: Secondary | ICD-10-CM | POA: Diagnosis not present

## 2024-08-28 DIAGNOSIS — M1990 Unspecified osteoarthritis, unspecified site: Secondary | ICD-10-CM | POA: Diagnosis not present

## 2024-08-28 DIAGNOSIS — N181 Chronic kidney disease, stage 1: Secondary | ICD-10-CM | POA: Diagnosis not present

## 2024-08-28 DIAGNOSIS — D84821 Immunodeficiency due to drugs: Secondary | ICD-10-CM | POA: Diagnosis not present

## 2024-08-28 DIAGNOSIS — Z7982 Long term (current) use of aspirin: Secondary | ICD-10-CM | POA: Diagnosis not present

## 2024-08-28 DIAGNOSIS — I129 Hypertensive chronic kidney disease with stage 1 through stage 4 chronic kidney disease, or unspecified chronic kidney disease: Secondary | ICD-10-CM | POA: Diagnosis not present

## 2024-08-28 DIAGNOSIS — I700 Atherosclerosis of aorta: Secondary | ICD-10-CM | POA: Diagnosis not present

## 2024-08-28 DIAGNOSIS — M545 Low back pain, unspecified: Secondary | ICD-10-CM | POA: Diagnosis not present

## 2024-08-28 DIAGNOSIS — E1136 Type 2 diabetes mellitus with diabetic cataract: Secondary | ICD-10-CM | POA: Diagnosis not present

## 2024-08-28 DIAGNOSIS — H409 Unspecified glaucoma: Secondary | ICD-10-CM | POA: Diagnosis not present

## 2024-08-28 DIAGNOSIS — C61 Malignant neoplasm of prostate: Secondary | ICD-10-CM | POA: Diagnosis not present

## 2024-08-28 DIAGNOSIS — I2510 Atherosclerotic heart disease of native coronary artery without angina pectoris: Secondary | ICD-10-CM | POA: Diagnosis not present

## 2024-08-28 DIAGNOSIS — N400 Enlarged prostate without lower urinary tract symptoms: Secondary | ICD-10-CM | POA: Diagnosis not present

## 2024-08-28 DIAGNOSIS — E1122 Type 2 diabetes mellitus with diabetic chronic kidney disease: Secondary | ICD-10-CM | POA: Diagnosis not present

## 2024-08-28 DIAGNOSIS — E785 Hyperlipidemia, unspecified: Secondary | ICD-10-CM | POA: Diagnosis not present

## 2024-08-28 DIAGNOSIS — R011 Cardiac murmur, unspecified: Secondary | ICD-10-CM | POA: Diagnosis not present

## 2024-08-28 DIAGNOSIS — K08409 Partial loss of teeth, unspecified cause, unspecified class: Secondary | ICD-10-CM | POA: Diagnosis not present

## 2024-08-28 DIAGNOSIS — M109 Gout, unspecified: Secondary | ICD-10-CM | POA: Diagnosis not present

## 2024-09-07 DIAGNOSIS — Z6828 Body mass index (BMI) 28.0-28.9, adult: Secondary | ICD-10-CM | POA: Diagnosis not present

## 2024-09-07 DIAGNOSIS — E119 Type 2 diabetes mellitus without complications: Secondary | ICD-10-CM | POA: Diagnosis not present

## 2024-09-07 DIAGNOSIS — I10 Essential (primary) hypertension: Secondary | ICD-10-CM | POA: Diagnosis not present

## 2024-09-07 DIAGNOSIS — M179 Osteoarthritis of knee, unspecified: Secondary | ICD-10-CM | POA: Diagnosis not present

## 2024-09-09 DIAGNOSIS — C61 Malignant neoplasm of prostate: Secondary | ICD-10-CM | POA: Diagnosis not present

## 2024-09-16 DIAGNOSIS — C61 Malignant neoplasm of prostate: Secondary | ICD-10-CM | POA: Diagnosis not present

## 2024-09-16 DIAGNOSIS — R3915 Urgency of urination: Secondary | ICD-10-CM | POA: Diagnosis not present

## 2024-09-29 DIAGNOSIS — H401133 Primary open-angle glaucoma, bilateral, severe stage: Secondary | ICD-10-CM | POA: Diagnosis not present
